# Patient Record
Sex: Female | Born: 1985
Health system: Southern US, Community
[De-identification: ages and names within clinical notes are randomized; demographics above are authoritative.]

## PROBLEM LIST (undated history)

## (undated) DIAGNOSIS — J189 Pneumonia, unspecified organism: Secondary | ICD-10-CM

## (undated) DIAGNOSIS — J45909 Unspecified asthma, uncomplicated: Secondary | ICD-10-CM

## (undated) DIAGNOSIS — M7630 Iliotibial band syndrome, unspecified leg: Secondary | ICD-10-CM

## (undated) DIAGNOSIS — G43909 Migraine, unspecified, not intractable, without status migrainosus: Secondary | ICD-10-CM

## (undated) DIAGNOSIS — IMO0002 Reserved for concepts with insufficient information to code with codable children: Secondary | ICD-10-CM

## (undated) DIAGNOSIS — M545 Low back pain, unspecified: Secondary | ICD-10-CM

## (undated) DIAGNOSIS — M1990 Unspecified osteoarthritis, unspecified site: Secondary | ICD-10-CM

## (undated) DIAGNOSIS — M5126 Other intervertebral disc displacement, lumbar region: Secondary | ICD-10-CM

## (undated) HISTORY — DX: Reserved for concepts with insufficient information to code with codable children: IMO0002

## (undated) HISTORY — DX: Other intervertebral disc displacement, lumbar region: M51.26

## (undated) HISTORY — DX: Unspecified asthma, uncomplicated: J45.909

## (undated) HISTORY — DX: Iliotibial band syndrome, unspecified leg: M76.30

---

## 2009-01-28 ENCOUNTER — Emergency Department (HOSPITAL_COMMUNITY): Admission: EM | Admit: 2009-01-28 | Discharge: 2009-01-28 | Payer: Self-pay | Admitting: Family Medicine

## 2011-05-04 ENCOUNTER — Emergency Department
Admission: EM | Admit: 2011-05-04 | Discharge: 2011-05-04 | Disposition: A | Discharge status: Home / Self Care | Payer: 59 | Source: Home / Self Care | Attending: Emergency Medicine | Admitting: Emergency Medicine

## 2011-05-04 ENCOUNTER — Encounter: Payer: Self-pay | Admitting: Emergency Medicine

## 2011-05-04 ENCOUNTER — Emergency Department
Admit: 2011-05-04 | Discharge: 2011-05-04 | Disposition: A | Discharge status: Home / Self Care | Payer: 59 | Attending: Emergency Medicine | Admitting: Emergency Medicine

## 2011-05-04 DIAGNOSIS — M25569 Pain in unspecified knee: Secondary | ICD-10-CM

## 2011-05-04 DIAGNOSIS — M25561 Pain in right knee: Secondary | ICD-10-CM

## 2011-05-04 HISTORY — DX: Low back pain: M54.5

## 2011-05-04 HISTORY — DX: Unspecified osteoarthritis, unspecified site: M19.90

## 2011-05-04 HISTORY — DX: Low back pain, unspecified: M54.50

## 2011-05-04 HISTORY — DX: Migraine, unspecified, not intractable, without status migrainosus: G43.909

## 2011-05-04 MED ORDER — TRAMADOL HCL 50 MG PO TABS: 50.0000 mg | ORAL_TABLET | Freq: Four times a day (QID) | ORAL | Status: AC | PRN

## 2011-05-04 NOTE — ED Notes (Signed)
Incidentally: trying to achieve pregnancy; currently negative HCG

## 2011-05-04 NOTE — ED Notes (Signed)
Right knee pain started at work after 6 miles of walking; hx knee problems in HS cheerleading.

## 2011-05-04 NOTE — ED Provider Notes (Signed)
History     CSN: [id number]  Arrival date & time 05/04/11  1242   First MD Initiated Contact with Patient 05/04/11 1330      Chief Complaint  Patient presents with  . 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 She does not recall any specific trauma, falling, or other injuries but feels that she overexerted herself. She now has 5/10 pain at rest and 10 at 10 with movement. It is located mostly on the front of her knee as well as the inner aspect. 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 She does not think that she can go back to work with this pain and did call orthopedist but they are not able to see her for a few days.  Past Medical History  Diagnosis Date  . Arthritis   . Low back pain   . Migraines     History reviewed. No pertinent past surgical history.  No family history on file.  History  Substance Use Topics  . Smoking status: Never Smoker   . Smokeless tobacco: Not on file  . Alcohol Use: Yes    OB History    Grav Para Term Preterm Abortions TAB SAB Ect Mult Living                  Review of Systems  Allergies  Review of patient's allergies indicates no known allergies.  Home Medications   Current Outpatient Rx  Name Route Sig Dispense Refill  . BUTALBITAL-APAP-CAFFEINE 50-325-40 MG PO TABS Oral Take 1 tablet by mouth at bedtime as needed.      Marland Kitchen RIZATRIPTAN BENZOATE 10 MG PO TABS Oral Take 10 mg by mouth at bedtime as needed. May repeat in 2 hours if needed     . TRAMADOL HCL 50 MG PO TABS Oral Take 1 tablet (50 mg total) by mouth every 6 (six) hours as needed for pain. Maximum dose= 8 tablets per day 24 tablet 0    BP 120/82  Pulse 89  Temp(Src) 98.5 F (36.9 C) (Oral)  Resp 16  Ht  5\' 4"  (1.626 m)  Wt 150 lb (68.04 kg)  BMI 25.75 kg/m2  SpO2 98%  LMP 02/02/2011  Physical Exam  Nursing note and vitals reviewed. Constitutional: She is oriented to person, place, and time. She appears well-developed and well-nourished.  HENT:  Head: Normocephalic and atraumatic.  Eyes: No scleral icterus.  Neck: Neck supple.  Cardiovascular: Regular rhythm and normal heart sounds.   Pulmonary/Chest: Effort normal and breath sounds normal. No respiratory distress.  Musculoskeletal:       R knee: Full range of motion but limited with full extension and full flexion by pain, +1 effusion, no ecchymoses, Anterior & posterior drawer normal, McMurrays is painful, Varus & valgus stress normal.  Patella freely mobile, Clarks compression test is painful as is manipulation of the patellar facets.  Good alignment. Distal neurovascular status is intact.   Neurological: She is alert and oriented to person, place, and time.  Skin: Skin is warm and dry.  Psychiatric: She has a normal mood and affect. Her speech is normal.    ED Course  Procedures (including critical care time)  Labs Reviewed - No data to display Dg  Knee Complete 4 Views Right  05/04/2011  *RADIOLOGY REPORT*  Clinical Data: Right knee pain and effusion.  RIGHT KNEE - COMPLETE 4+ VIEW  Comparison: None available.  Findings: The right knee is located.  There is no significant effusion.  No acute bone or soft tissue abnormality is present.  IMPRESSION: Negative right knee.  Original Report Authenticated By: Jamesetta Orleans. MATTERN, M.D.     1. Right knee pain       MDM   An x-ray was obtained of the right knee and is read by radiology as above.  Tramadol Rx given.  I also gave her. Like her to be seen by a sports medicine doctor or orthopedist this week.  She has an appointment with Dr. Penni Bombard in 2 days.  At that time they can discuss the need for physical therapy versus bracing versus advanced imaging.     Lily Kocher, MD 05/04/11 [phone]

## 2011-05-25 ENCOUNTER — Other Ambulatory Visit: Payer: Self-pay | Admitting: Sports Medicine

## 2011-05-25 DIAGNOSIS — M25551 Pain in right hip: Secondary | ICD-10-CM

## 2011-05-29 ENCOUNTER — Inpatient Hospital Stay (HOSPITAL_COMMUNITY): Admission: RE | Admit: 2011-05-29 | Payer: 59 | Source: Ambulatory Visit

## 2012-05-31 ENCOUNTER — Ambulatory Visit (INDEPENDENT_AMBULATORY_CARE_PROVIDER_SITE_OTHER): Payer: 59 | Admitting: Sports Medicine

## 2012-05-31 ENCOUNTER — Encounter: Payer: Self-pay | Admitting: Sports Medicine

## 2012-05-31 VITALS — BP 130/88 | HR 113 | Wt 169.0 lb

## 2012-05-31 DIAGNOSIS — R059 Cough, unspecified: Secondary | ICD-10-CM

## 2012-05-31 DIAGNOSIS — R05 Cough: Secondary | ICD-10-CM

## 2012-05-31 MED ORDER — HYDROCOD POLST-CHLORPHEN POLST 10-8 MG/5ML PO LQCR: 5.0000 mL | Freq: Two times a day (BID) | ORAL | Status: DC | PRN

## 2012-05-31 MED ORDER — ONDANSETRON 8 MG PO TBDP: 8.0000 mg | ORAL_TABLET | Freq: Three times a day (TID) | ORAL | Status: DC | PRN

## 2012-05-31 MED ORDER — MELOXICAM 15 MG PO TABS: ORAL_TABLET | ORAL | Status: DC

## 2012-05-31 MED ORDER — OSELTAMIVIR PHOSPHATE 75 MG PO CAPS: 75.0000 mg | ORAL_CAPSULE | Freq: Two times a day (BID) | ORAL | Status: DC

## 2012-05-31 NOTE — Assessment & Plan Note (Signed)
Symptoms likely represent viral respiratory infection, she does have some influenza-like symptoms. She is within the window, we will start Tamiflu. Tussionex for cough, Mobic for symptoms , Zofran for nausea. RTC 1 week prn.

## 2012-05-31 NOTE — Progress Notes (Signed)
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  She's had 2 days of weakness, fatigue, cough productive of yellowish sputum, mild nausea, sore throat. She denies nasal discharge, ear pain, sinus pressure, vomiting or diarrhea. Symptoms are worsening. She does have body aches, she did get her flu shot this year.  Past medical history, Surgical history, Family history not pertinant except as noted below, Social history, Allergies, and medications have been entered into the medical record, reviewed, and no changes needed.   Review of Systems: No headache, visual changes, vomiting, diarrhea, constipation, dizziness, abdominal pain, skin rash, fevers, chills, night sweats, swollen lymph nodes, weight loss, chest pain, joint swelling, shortness of breath, mood changes, visual or auditory hallucinations. Positive for muscle aches, body aches, nausea.  Objective:    General: Well Developed, well nourished, and in no acute distress.  Neuro: Alert and oriented x3, extra-ocular muscles intact, sensation grossly intact.  HEENT: Normocephalic, atraumatic, pupils equal round reactive to light, neck supple, no masses, no lymphadenopathy, thyroid nonpalpable. Oropharynx, nasopharynx, external ear canals are unremarkable to inspection. Skin: Warm and dry, no rashes noted.  Cardiac: Regular rate and rhythm, no murmurs rubs or gallops.  Respiratory: Clear to auscultation bilaterally. Not using accessory muscles, speaking in full sentences.  Abdominal: Soft, nontender, nondistended, positive bowel sounds, no masses, no organomegaly.  Musculoskeletal: Shoulder, elbow, wrist, hip, knee, ankle stable, and with full range of motion.  Impression and Recommendations:    The patient was counselled, risk factors were discussed, anticipatory guidance given.

## 2012-06-17 ENCOUNTER — Encounter: Payer: 59 | Admitting: Sports Medicine

## 2012-07-26 LAB — OB RESULTS CONSOLE RPR: RPR: NONREACTIVE

## 2012-07-26 LAB — OB RESULTS CONSOLE RUBELLA ANTIBODY, IGM: Rubella: IMMUNE

## 2012-07-26 LAB — OB RESULTS CONSOLE ABO/RH: RH Type: NEGATIVE

## 2012-07-26 LAB — OB RESULTS CONSOLE HIV ANTIBODY (ROUTINE TESTING): HIV: NONREACTIVE

## 2012-07-26 LAB — OB RESULTS CONSOLE HGB/HCT, BLOOD: Hemoglobin: 13.4 g/dL

## 2012-10-04 ENCOUNTER — Telehealth: Payer: Self-pay | Admitting: *Deleted

## 2012-10-04 DIAGNOSIS — Z3490 Encounter for supervision of normal pregnancy, unspecified, unspecified trimester: Secondary | ICD-10-CM

## 2012-10-04 NOTE — Telephone Encounter (Signed)
Pt scheduled for f/u U/S on 10/17/12 @ 7 am @ [facility] in [place]

## 2012-10-07 ENCOUNTER — Other Ambulatory Visit: Payer: Self-pay | Admitting: Obstetrics & Gynecology

## 2012-10-07 DIAGNOSIS — Z3490 Encounter for supervision of normal pregnancy, unspecified, unspecified trimester: Secondary | ICD-10-CM

## 2012-10-10 ENCOUNTER — Encounter: Payer: Self-pay | Admitting: Obstetrics and Gynecology

## 2012-10-10 ENCOUNTER — Encounter: Payer: Self-pay | Admitting: *Deleted

## 2012-10-11 ENCOUNTER — Ambulatory Visit (INDEPENDENT_AMBULATORY_CARE_PROVIDER_SITE_OTHER): Payer: 59 | Admitting: Obstetrics and Gynecology

## 2012-10-11 ENCOUNTER — Encounter: Payer: Self-pay | Admitting: Obstetrics and Gynecology

## 2012-10-11 VITALS — BP 134/80 | Wt 178.0 lb

## 2012-10-11 DIAGNOSIS — O360990 Maternal care for other rhesus isoimmunization, unspecified trimester, not applicable or unspecified: Secondary | ICD-10-CM

## 2012-10-11 DIAGNOSIS — O0902 Supervision of pregnancy with history of infertility, second trimester: Secondary | ICD-10-CM

## 2012-10-11 DIAGNOSIS — Z6791 Unspecified blood type, Rh negative: Secondary | ICD-10-CM | POA: Insufficient documentation

## 2012-10-11 DIAGNOSIS — O26899 Other specified pregnancy related conditions, unspecified trimester: Secondary | ICD-10-CM | POA: Insufficient documentation

## 2012-10-11 DIAGNOSIS — O3092 Multiple gestation, unspecified, second trimester: Secondary | ICD-10-CM

## 2012-10-11 DIAGNOSIS — G43909 Migraine, unspecified, not intractable, without status migrainosus: Secondary | ICD-10-CM

## 2012-10-11 DIAGNOSIS — Z3480 Encounter for supervision of other normal pregnancy, unspecified trimester: Secondary | ICD-10-CM

## 2012-10-11 DIAGNOSIS — O0900 Supervision of pregnancy with history of infertility, unspecified trimester: Secondary | ICD-10-CM

## 2012-10-11 DIAGNOSIS — O3090 Multiple gestation, unspecified, unspecified trimester: Secondary | ICD-10-CM | POA: Insufficient documentation

## 2012-10-11 DIAGNOSIS — Q270 Congenital absence and hypoplasia of umbilical artery: Secondary | ICD-10-CM | POA: Insufficient documentation

## 2012-10-11 DIAGNOSIS — O360123 Maternal care for anti-D [Rh] antibodies, second trimester, fetus 3: Secondary | ICD-10-CM

## 2012-10-11 NOTE — Progress Notes (Signed)
Patient transferred care from [facility]. Limited prenatal record consisting mainly of 3  ultrasounds and prenatal labs reviewed. All questions answered. Patient is otherwise doing well and is without complaints. She is planning on breast feeding. She remains undecided contraception. Nature of our physician/midwife practise explained. F/U MFM ultrasound on 6/13

## 2012-10-11 NOTE — Progress Notes (Signed)
p=120 

## 2012-10-14 ENCOUNTER — Ambulatory Visit (HOSPITAL_COMMUNITY)
Admission: RE | Admit: 2012-10-14 | Discharge: 2012-10-14 | Disposition: A | Discharge status: Home / Self Care | Payer: 59 | Source: Ambulatory Visit | Attending: Obstetrics & Gynecology | Admitting: Obstetrics & Gynecology

## 2012-10-14 VITALS — BP 111/69 | HR 115 | Wt 180.2 lb

## 2012-10-14 DIAGNOSIS — Z3490 Encounter for supervision of normal pregnancy, unspecified, unspecified trimester: Secondary | ICD-10-CM

## 2012-10-14 DIAGNOSIS — Z1389 Encounter for screening for other disorder: Secondary | ICD-10-CM | POA: Insufficient documentation

## 2012-10-14 DIAGNOSIS — Z363 Encounter for antenatal screening for malformations: Secondary | ICD-10-CM | POA: Insufficient documentation

## 2012-10-14 DIAGNOSIS — O30109 Triplet pregnancy, unspecified number of placenta and unspecified number of amniotic sacs, unspecified trimester: Secondary | ICD-10-CM | POA: Insufficient documentation

## 2012-10-14 DIAGNOSIS — O3092 Multiple gestation, unspecified, second trimester: Secondary | ICD-10-CM

## 2012-10-14 DIAGNOSIS — O358XX0 Maternal care for other (suspected) fetal abnormality and damage, not applicable or unspecified: Secondary | ICD-10-CM | POA: Insufficient documentation

## 2012-10-14 NOTE — Progress Notes (Signed)
Tina Jennings  was seen today for an ultrasound appointment.  See full report in [facility].  Impression:  Tri/ Tri triplet gestation with best dates of 21 3/7 weeks  A: breech, anterior placenta     Single umbilical artery     Normal fetal anatomy, although limited views of the fetal heart and spine obtained due to fetal position     No other markers associated with aneuploidy identified     Normal amniotic fluid volume  B: breech, maternal right      Normal fetal anatomy; limited views of the fetal heart obtained due to fetal position      No markers associated with aneuploidy noted      Normal amniotic fluid volume  C: transverse, posterior placenta      Normal fetal anatmoy; RVOT not seen      No markers associated with aneuploidy noted      Normal amniotic fluid volume  Recommendations:  Fetal echo due to 2 vessel cord in Triplet A. Follow up growth in 3-4 weeks.  Alpha Gula, MD

## 2012-10-17 ENCOUNTER — Ambulatory Visit (HOSPITAL_COMMUNITY): Payer: 59

## 2012-11-01 ENCOUNTER — Ambulatory Visit (INDEPENDENT_AMBULATORY_CARE_PROVIDER_SITE_OTHER): Payer: 59 | Admitting: Obstetrics & Gynecology

## 2012-11-01 VITALS — BP 126/79 | Wt 181.0 lb

## 2012-11-01 DIAGNOSIS — O3090 Multiple gestation, unspecified, unspecified trimester: Secondary | ICD-10-CM

## 2012-11-01 DIAGNOSIS — Q270 Congenital absence and hypoplasia of umbilical artery: Secondary | ICD-10-CM

## 2012-11-01 DIAGNOSIS — Z3480 Encounter for supervision of other normal pregnancy, unspecified trimester: Secondary | ICD-10-CM

## 2012-11-01 DIAGNOSIS — O3092 Multiple gestation, unspecified, second trimester: Secondary | ICD-10-CM

## 2012-11-01 NOTE — Progress Notes (Signed)
Followed by serial growth scans at [facility]. 1 hr GTT, third trimester labs, Tdap, Rhogam at next visit.  No other complaints or concerns.  Fetal movement and labor precautions reviewed.

## 2012-11-01 NOTE — Progress Notes (Signed)
p=107 

## 2012-11-01 NOTE — Patient Instructions (Signed)
Return to clinic for any obstetric concerns or go to [facility] for evaluation  

## 2012-11-02 ENCOUNTER — Ambulatory Visit (HOSPITAL_COMMUNITY)
Admission: RE | Admit: 2012-11-02 | Discharge: 2012-11-02 | Disposition: A | Discharge status: Home / Self Care | Payer: 59 | Source: Ambulatory Visit | Attending: Obstetrics & Gynecology | Admitting: Obstetrics & Gynecology

## 2012-11-02 DIAGNOSIS — O30109 Triplet pregnancy, unspecified number of placenta and unspecified number of amniotic sacs, unspecified trimester: Secondary | ICD-10-CM | POA: Insufficient documentation

## 2012-11-02 DIAGNOSIS — O3092 Multiple gestation, unspecified, second trimester: Secondary | ICD-10-CM

## 2012-11-02 DIAGNOSIS — Z3689 Encounter for other specified antenatal screening: Secondary | ICD-10-CM | POA: Insufficient documentation

## 2012-11-02 NOTE — Progress Notes (Signed)
Ms. Brewton was seen for ultrasound appointment today.  Please see [facility] report for details.

## 2012-11-04 ENCOUNTER — Encounter: Payer: Self-pay | Admitting: Obstetrics & Gynecology

## 2012-11-24 MED ORDER — RHO D IMMUNE GLOBULIN 1500 UNIT/2ML IJ SOLN
300.0000 ug | Freq: Once | INTRAMUSCULAR | Status: AC
  Administered 2012-11-25: 300 ug via INTRAMUSCULAR

## 2012-11-24 MED ORDER — TETANUS-DIPHTH-ACELL PERTUSSIS 5-2.5-18.5 LF-MCG/0.5 IM SUSP
0.5000 mL | Freq: Once | INTRAMUSCULAR | Status: AC
  Administered 2012-11-25: 0.5 mL via INTRAMUSCULAR

## 2012-11-25 ENCOUNTER — Ambulatory Visit (INDEPENDENT_AMBULATORY_CARE_PROVIDER_SITE_OTHER): Payer: 59 | Admitting: Advanced Practice Midwife

## 2012-11-25 ENCOUNTER — Other Ambulatory Visit: Payer: Self-pay | Admitting: Obstetrics & Gynecology

## 2012-11-25 VITALS — BP 120/81 | Wt 188.0 lb

## 2012-11-25 DIAGNOSIS — O3090 Multiple gestation, unspecified, unspecified trimester: Secondary | ICD-10-CM

## 2012-11-25 DIAGNOSIS — Z23 Encounter for immunization: Secondary | ICD-10-CM

## 2012-11-25 DIAGNOSIS — Z3480 Encounter for supervision of other normal pregnancy, unspecified trimester: Secondary | ICD-10-CM

## 2012-11-25 DIAGNOSIS — O360990 Maternal care for other rhesus isoimmunization, unspecified trimester, not applicable or unspecified: Secondary | ICD-10-CM

## 2012-11-25 DIAGNOSIS — O30109 Triplet pregnancy, unspecified number of placenta and unspecified number of amniotic sacs, unspecified trimester: Secondary | ICD-10-CM

## 2012-11-25 DIAGNOSIS — O3092 Multiple gestation, unspecified, second trimester: Secondary | ICD-10-CM

## 2012-11-25 DIAGNOSIS — O360123 Maternal care for anti-D [Rh] antibodies, second trimester, fetus 3: Secondary | ICD-10-CM

## 2012-11-25 LAB — CBC
HCT: 35 % — ABNORMAL LOW (ref 36.0–46.0)
Hemoglobin: 11.9 g/dL — ABNORMAL LOW (ref 12.0–15.0)
MCH: 30.7 pg (ref 26.0–34.0)
MCHC: 34 g/dL (ref 30.0–36.0)
MCV: 90.2 fL (ref 78.0–100.0)
RBC: 3.88 MIL/uL (ref 3.87–5.11)

## 2012-11-25 MED ORDER — TETANUS-DIPHTH-ACELL PERTUSSIS 5-2.5-18.5 LF-MCG/0.5 IM SUSP: 0.5000 mL | Freq: Once | INTRAMUSCULAR | Status: DC

## 2012-11-25 MED ORDER — RHO D IMMUNE GLOBULIN 1500 UNIT/2ML IJ SOLN: 300.0000 ug | Freq: Once | INTRAMUSCULAR | Status: DC

## 2012-11-25 NOTE — Progress Notes (Signed)
Doing well.  Good fetal movement, denies vaginal bleeding, LOF, regular contractions.  Working as a Engineer, civil (consulting) at [employer]. Has 2+ pitting edema after working, resolves with rest, elevation.  Discussed support socks/stockings.  Answered questions about C/S and recovery, and about breastfeeding.

## 2012-11-25 NOTE — Progress Notes (Signed)
P - 102 - FHR: 141, 153, and 137

## 2012-11-26 LAB — RPR

## 2012-11-26 LAB — ANTIBODY SCREEN: Antibody Screen: NEGATIVE

## 2012-11-26 LAB — HIV ANTIBODY (ROUTINE TESTING W REFLEX): HIV: NONREACTIVE

## 2012-11-28 ENCOUNTER — Telehealth: Payer: Self-pay | Admitting: *Deleted

## 2012-11-28 NOTE — Telephone Encounter (Signed)
LM on cell phone that she did pass her 1 hr GTT.

## 2012-11-29 ENCOUNTER — Ambulatory Visit (HOSPITAL_COMMUNITY)
Admission: RE | Admit: 2012-11-29 | Discharge: 2012-11-29 | Disposition: A | Discharge status: Home / Self Care | Payer: 59 | Source: Ambulatory Visit | Attending: Family Medicine | Admitting: Family Medicine

## 2012-11-29 DIAGNOSIS — Z3689 Encounter for other specified antenatal screening: Secondary | ICD-10-CM | POA: Insufficient documentation

## 2012-11-29 DIAGNOSIS — O30109 Triplet pregnancy, unspecified number of placenta and unspecified number of amniotic sacs, unspecified trimester: Secondary | ICD-10-CM | POA: Insufficient documentation

## 2012-12-05 ENCOUNTER — Encounter: Payer: Self-pay | Admitting: Obstetrics & Gynecology

## 2012-12-06 ENCOUNTER — Encounter: Payer: Self-pay | Admitting: *Deleted

## 2012-12-13 ENCOUNTER — Ambulatory Visit (INDEPENDENT_AMBULATORY_CARE_PROVIDER_SITE_OTHER): Payer: 59 | Admitting: Obstetrics & Gynecology

## 2012-12-13 VITALS — BP 143/91 | Wt 202.0 lb

## 2012-12-13 DIAGNOSIS — Z3480 Encounter for supervision of other normal pregnancy, unspecified trimester: Secondary | ICD-10-CM

## 2012-12-13 DIAGNOSIS — O133 Gestational [pregnancy-induced] hypertension without significant proteinuria, third trimester: Secondary | ICD-10-CM

## 2012-12-13 DIAGNOSIS — O139 Gestational [pregnancy-induced] hypertension without significant proteinuria, unspecified trimester: Secondary | ICD-10-CM

## 2012-12-13 NOTE — Progress Notes (Signed)
p-88  BP recheck 136/93  PIH labs drawn

## 2012-12-13 NOTE — Progress Notes (Signed)
P - 123 

## 2012-12-14 ENCOUNTER — Ambulatory Visit (HOSPITAL_COMMUNITY)
Admission: RE | Admit: 2012-12-14 | Discharge: 2012-12-14 | Disposition: A | Discharge status: Home / Self Care | Payer: 59 | Source: Ambulatory Visit | Attending: Obstetrics & Gynecology | Admitting: Obstetrics & Gynecology

## 2012-12-14 ENCOUNTER — Other Ambulatory Visit: Payer: Self-pay | Admitting: Obstetrics & Gynecology

## 2012-12-14 ENCOUNTER — Encounter: Payer: Self-pay | Admitting: *Deleted

## 2012-12-14 VITALS — BP 128/85 | HR 92 | Wt 203.0 lb

## 2012-12-14 DIAGNOSIS — O139 Gestational [pregnancy-induced] hypertension without significant proteinuria, unspecified trimester: Secondary | ICD-10-CM | POA: Insufficient documentation

## 2012-12-14 DIAGNOSIS — O30109 Triplet pregnancy, unspecified number of placenta and unspecified number of amniotic sacs, unspecified trimester: Secondary | ICD-10-CM | POA: Insufficient documentation

## 2012-12-14 DIAGNOSIS — O133 Gestational [pregnancy-induced] hypertension without significant proteinuria, third trimester: Secondary | ICD-10-CM

## 2012-12-14 DIAGNOSIS — O409XX0 Polyhydramnios, unspecified trimester, not applicable or unspecified: Secondary | ICD-10-CM | POA: Insufficient documentation

## 2012-12-14 DIAGNOSIS — IMO0001 Reserved for inherently not codable concepts without codable children: Secondary | ICD-10-CM | POA: Insufficient documentation

## 2012-12-14 LAB — COMPREHENSIVE METABOLIC PANEL
ALT: 19 U/L (ref 0–35)
Albumin: 2.7 g/dL — ABNORMAL LOW (ref 3.5–5.2)
CO2: 21 mEq/L (ref 19–32)
Calcium: 8.5 mg/dL (ref 8.4–10.5)
Chloride: 105 mEq/L (ref 96–112)
Glucose, Bld: 117 mg/dL — ABNORMAL HIGH (ref 70–99)
Sodium: 137 mEq/L (ref 135–145)
Total Bilirubin: 0.8 mg/dL (ref 0.3–1.2)
Total Protein: 5.3 g/dL — ABNORMAL LOW (ref 6.0–8.3)

## 2012-12-14 LAB — CBC
Hemoglobin: 11.5 g/dL — ABNORMAL LOW (ref 12.0–15.0)
MCH: 30.2 pg (ref 26.0–34.0)
Platelets: 148 10*3/uL — ABNORMAL LOW (ref 150–400)
RBC: 3.81 MIL/uL — ABNORMAL LOW (ref 3.87–5.11)
WBC: 8.8 10*3/uL (ref 4.0–10.5)

## 2012-12-14 NOTE — Progress Notes (Signed)
BP slightly elevated today.  Will get labs and 24 hour urine.  BPP tomorrow with MFM.  Preeclampsia precautions given.  MFM to give further guidelines on delivery 34-36 weeks.  Pt prefers 36 weeks.

## 2012-12-14 NOTE — Progress Notes (Signed)
[facility] ultrasound  Indication: 27 yr old G1P0 at [redacted]w[redacted]d with trichorionic/triamniotic triplet gestation for BPP. Triplet A with single umbilical artery; triplet B with polyhydramnios.  Findings: 1. Trichorionic/triamniotic triplet gestation; the dividing membranes are seen. 2. Triplet A and B placentas are anterior; triplet C placenta is posterior; there is no evidence of previa. 3. Normal amniotic fluid volume for triplets A and C; triplet B has polyhydramnios with maximum vertical pocket of 9.9cm. 4. Normal biophysical profiles of 8/8 for all three fetuses.  Recommendations: 1. Triplet pregnancy: - previously counseled - recommend fetal growth every 4 weeks; due in 2 weeks - recommend antenatal testing with weekly BPPs - preterm labor precautions - recommend delivery at 36 weeks - would have low threshold for administering betamethasone (contractions, cervical change, etc) 2. Polyhydramnios in triplet B: - unclear etiology - discussed possible etiologies: idiopathic, gestational diabetes (had normal glucola), anomaly (normal stomach seen), aneuploidy, infection, neuromuscular disorder (normal fetal movement seen)- based on previous studies likely idiopathic - there is an increased risk of preterm labor/PPROM and stillbirth therefore recommend precautions and antenatal testing as above 3. Triplet A with single umbilical artery: - previously counseled - follow fetal growth - triplet A had normal fetal echocardiogram  Eulis Foster, MD

## 2012-12-15 ENCOUNTER — Other Ambulatory Visit: Payer: Self-pay | Admitting: *Deleted

## 2012-12-15 NOTE — Telephone Encounter (Signed)
Message copied by Granville Lewis on Thu Dec 15, 2012  9:24 AM ------      Message from: Lesly Dukes      Created: Wed Dec 14, 2012 10:01 PM       plts mildly low, protein creatinine ratio is .26, creatinine is high nml.  I think she is definitely brewing.  Question si when to give steroids.  I should have low threshold so I am thinking next week when we see her.  I'm going to think about it tonight.  Technicially she doesn't have a diagnosis yet.   ------

## 2012-12-15 NOTE — Telephone Encounter (Signed)
Spoke with pt about labs and possibility of getting Betamethasone next week at her appt. per  Dr Penne Lash .

## 2012-12-16 ENCOUNTER — Other Ambulatory Visit: Payer: Self-pay | Admitting: Obstetrics & Gynecology

## 2012-12-17 LAB — CREATININE CLEARANCE, URINE, 24 HOUR
Creatinine, Urine: 85.8 mg/dL
Creatinine: 0.79 mg/dL (ref 0.50–1.10)

## 2012-12-18 ENCOUNTER — Telehealth: Payer: Self-pay | Admitting: Obstetrics & Gynecology

## 2012-12-18 ENCOUNTER — Other Ambulatory Visit: Payer: Self-pay | Admitting: Obstetrics & Gynecology

## 2012-12-18 DIAGNOSIS — O24313 Unspecified pre-existing diabetes mellitus in pregnancy, third trimester: Secondary | ICD-10-CM | POA: Insufficient documentation

## 2012-12-18 LAB — PROTEIN, URINE, 24 HOUR
Protein, 24H Urine: 440 mg/d — ABNORMAL HIGH (ref 50–100)
Protein, Urine: 22 mg/dL

## 2012-12-18 NOTE — Telephone Encounter (Signed)
Pt turned in 24 hour urine-->440mg  of protein.  Pt needs betamethasone, rpt labs, MFM co management for antenatal testing and delivery.    Will rpt labs next week.  Pt aware and coming to [facility] for betamethasone on Monday and Tuesday.

## 2012-12-19 ENCOUNTER — Other Ambulatory Visit: Payer: Self-pay | Admitting: Obstetrics & Gynecology

## 2012-12-19 ENCOUNTER — Ambulatory Visit (INDEPENDENT_AMBULATORY_CARE_PROVIDER_SITE_OTHER): Payer: 59 | Admitting: *Deleted

## 2012-12-19 VITALS — BP 138/90

## 2012-12-19 DIAGNOSIS — O30109 Triplet pregnancy, unspecified number of placenta and unspecified number of amniotic sacs, unspecified trimester: Secondary | ICD-10-CM

## 2012-12-19 DIAGNOSIS — O3093 Multiple gestation, unspecified, third trimester: Secondary | ICD-10-CM

## 2012-12-19 DIAGNOSIS — O3090 Multiple gestation, unspecified, unspecified trimester: Secondary | ICD-10-CM

## 2012-12-19 DIAGNOSIS — R809 Proteinuria, unspecified: Secondary | ICD-10-CM

## 2012-12-19 MED ORDER — BETAMETHASONE SOD PHOS & ACET 6 (3-3) MG/ML IJ SUSP
12.0000 mg | INTRAMUSCULAR | Status: AC
  Administered 2012-12-19: 12 mg via INTRAMUSCULAR

## 2012-12-19 NOTE — Progress Notes (Signed)
[facility] patient in today for betamethasone only. Pt states that she is returning to [facility] tomorrow for the second BMZ injection. Note routed to Dr. Penne Lash.

## 2012-12-20 ENCOUNTER — Encounter: Payer: 59 | Admitting: Obstetrics & Gynecology

## 2012-12-20 ENCOUNTER — Encounter (HOSPITAL_COMMUNITY): Payer: Self-pay

## 2012-12-20 ENCOUNTER — Ambulatory Visit (INDEPENDENT_AMBULATORY_CARE_PROVIDER_SITE_OTHER): Payer: 59 | Admitting: *Deleted

## 2012-12-20 ENCOUNTER — Ambulatory Visit (HOSPITAL_COMMUNITY)
Admission: RE | Admit: 2012-12-20 | Discharge: 2012-12-20 | Disposition: A | Discharge status: Home / Self Care | Payer: 59 | Source: Ambulatory Visit | Attending: Obstetrics & Gynecology | Admitting: Obstetrics & Gynecology

## 2012-12-20 VITALS — BP 133/86 | HR 105 | Wt 210.0 lb

## 2012-12-20 DIAGNOSIS — O133 Gestational [pregnancy-induced] hypertension without significant proteinuria, third trimester: Secondary | ICD-10-CM

## 2012-12-20 DIAGNOSIS — O1403 Mild to moderate pre-eclampsia, third trimester: Secondary | ICD-10-CM

## 2012-12-20 DIAGNOSIS — IMO0001 Reserved for inherently not codable concepts without codable children: Secondary | ICD-10-CM | POA: Insufficient documentation

## 2012-12-20 DIAGNOSIS — O30109 Triplet pregnancy, unspecified number of placenta and unspecified number of amniotic sacs, unspecified trimester: Secondary | ICD-10-CM | POA: Insufficient documentation

## 2012-12-20 DIAGNOSIS — R809 Proteinuria, unspecified: Secondary | ICD-10-CM

## 2012-12-20 DIAGNOSIS — O409XX0 Polyhydramnios, unspecified trimester, not applicable or unspecified: Secondary | ICD-10-CM | POA: Insufficient documentation

## 2012-12-20 DIAGNOSIS — O3093 Multiple gestation, unspecified, third trimester: Secondary | ICD-10-CM

## 2012-12-20 DIAGNOSIS — O1410 Severe pre-eclampsia, unspecified trimester: Secondary | ICD-10-CM | POA: Insufficient documentation

## 2012-12-20 DIAGNOSIS — O139 Gestational [pregnancy-induced] hypertension without significant proteinuria, unspecified trimester: Secondary | ICD-10-CM | POA: Insufficient documentation

## 2012-12-20 DIAGNOSIS — IMO0002 Reserved for concepts with insufficient information to code with codable children: Secondary | ICD-10-CM

## 2012-12-20 DIAGNOSIS — O3090 Multiple gestation, unspecified, unspecified trimester: Secondary | ICD-10-CM

## 2012-12-20 LAB — CBC
HCT: 36.7 % (ref 36.0–46.0)
Hemoglobin: 12.6 g/dL (ref 12.0–15.0)
MCHC: 34.3 g/dL (ref 30.0–36.0)
RBC: 4.06 MIL/uL (ref 3.87–5.11)
WBC: 12.8 10*3/uL — ABNORMAL HIGH (ref 4.0–10.5)

## 2012-12-20 LAB — COMPREHENSIVE METABOLIC PANEL
Albumin: 2.7 g/dL — ABNORMAL LOW (ref 3.5–5.2)
Alkaline Phosphatase: 238 U/L — ABNORMAL HIGH (ref 39–117)
BUN: 6 mg/dL (ref 6–23)
Calcium: 8.5 mg/dL (ref 8.4–10.5)
Chloride: 106 mEq/L (ref 96–112)
Glucose, Bld: 74 mg/dL (ref 70–99)
Potassium: 4 mEq/L (ref 3.5–5.3)
Sodium: 137 mEq/L (ref 135–145)
Total Protein: 5.1 g/dL — ABNORMAL LOW (ref 6.0–8.3)

## 2012-12-20 MED ORDER — BETAMETHASONE SOD PHOS & ACET 6 (3-3) MG/ML IJ SUSP
12.0000 mg | Freq: Once | INTRAMUSCULAR | Status: AC
  Administered 2012-12-20: 12 mg via INTRAMUSCULAR

## 2012-12-20 NOTE — Progress Notes (Signed)
Pt here for 2nd Betamethasone injection of 12 mg.  CBC and CMP drawn today.  BP today is 133/86.

## 2012-12-20 NOTE — Addendum Note (Signed)
Addended by: Jaynie Collins A on: 12/20/2012 11:04 PM   Modules accepted: Orders

## 2012-12-21 ENCOUNTER — Telehealth: Payer: Self-pay | Admitting: *Deleted

## 2012-12-21 ENCOUNTER — Encounter: Payer: Self-pay | Admitting: Obstetrics & Gynecology

## 2012-12-21 NOTE — Telephone Encounter (Signed)
Dr Macon Large sent a message stating: "I was called by Loney Loh to report labs tonight. Please call [facility] to ensure they run the added-on LDH, I ordered this as an addendum order. Labs on 12/20/12 AST 42, ALT 34, Cr 0.91, Plts 145K, LDH added on. No severe features or HELLP yet. Recheck labs at next visit (12/22/12 with Dr. Penne Lash), consider admission for BMZ if concerning. Of note, patient is preeclamptic because DBP > 90 on 12/14/12 and 12/19/12. 24 hour protein 440 mg on 12/16/12. This was added to her problem list."  I verified LDH was added on but not resulted as of right now. Pt is to follow up with Dr. Penne Lash 12/22/12

## 2012-12-22 ENCOUNTER — Encounter: Payer: Self-pay | Admitting: *Deleted

## 2012-12-22 ENCOUNTER — Ambulatory Visit (INDEPENDENT_AMBULATORY_CARE_PROVIDER_SITE_OTHER): Payer: 59 | Admitting: Obstetrics & Gynecology

## 2012-12-22 VITALS — BP 147/92 | Wt 211.0 lb

## 2012-12-22 DIAGNOSIS — O30109 Triplet pregnancy, unspecified number of placenta and unspecified number of amniotic sacs, unspecified trimester: Secondary | ICD-10-CM

## 2012-12-22 DIAGNOSIS — Z3480 Encounter for supervision of other normal pregnancy, unspecified trimester: Secondary | ICD-10-CM

## 2012-12-22 NOTE — Progress Notes (Signed)
P - 89 - Pt has suffered with acid reflux with vomiting

## 2012-12-22 NOTE — Progress Notes (Signed)
Pt will have biweekly BPP and needs CMP and CBC weekly per Dr Penne Lash.  BPP's have been scheduled for the next few weeks.

## 2012-12-22 NOTE — Progress Notes (Signed)
Pt checks BP 6-7  at home   Highest has been 131/89.  Today's BP is 147/92.  Pt denies headaches, scotoma, RUQ pain.  Pt has been having reflux (burping acid) not relieved by Tums.  Pt to try Zantac.  If not better then pt will move to ppi.  Pt has mild preeclampsia.  SAT slightly elevated at 42 and plt stable at 145.  Lab ppt today.  BPP with MFM tomorrow with co-managing care.

## 2012-12-23 ENCOUNTER — Inpatient Hospital Stay (HOSPITAL_COMMUNITY): Payer: 59 | Admitting: Anesthesiology

## 2012-12-23 ENCOUNTER — Ambulatory Visit (HOSPITAL_COMMUNITY)
Admission: RE | Admit: 2012-12-23 | Discharge: 2012-12-23 | Disposition: A | Discharge status: Home / Self Care | Payer: 59 | Source: Ambulatory Visit | Attending: Obstetrics & Gynecology | Admitting: Obstetrics & Gynecology

## 2012-12-23 ENCOUNTER — Encounter (HOSPITAL_COMMUNITY)
Admission: AD | Disposition: A | Discharge status: Home / Self Care | Payer: Self-pay | Source: Ambulatory Visit | Attending: Obstetrics & Gynecology

## 2012-12-23 ENCOUNTER — Encounter (HOSPITAL_COMMUNITY): Payer: Self-pay | Admitting: *Deleted

## 2012-12-23 ENCOUNTER — Encounter (HOSPITAL_COMMUNITY): Payer: Self-pay | Admitting: Anesthesiology

## 2012-12-23 ENCOUNTER — Inpatient Hospital Stay (HOSPITAL_COMMUNITY)
Admission: AD | Admit: 2012-12-23 | Discharge: 2012-12-25 | DRG: 765 | Disposition: A | Discharge status: Home / Self Care | Payer: 59 | Source: Ambulatory Visit | Attending: Obstetrics & Gynecology | Admitting: Obstetrics & Gynecology

## 2012-12-23 DIAGNOSIS — O3090 Multiple gestation, unspecified, unspecified trimester: Secondary | ICD-10-CM | POA: Diagnosis present

## 2012-12-23 DIAGNOSIS — O26899 Other specified pregnancy related conditions, unspecified trimester: Secondary | ICD-10-CM | POA: Diagnosis present

## 2012-12-23 DIAGNOSIS — O1410 Severe pre-eclampsia, unspecified trimester: Secondary | ICD-10-CM | POA: Diagnosis present

## 2012-12-23 DIAGNOSIS — Q270 Congenital absence and hypoplasia of umbilical artery: Secondary | ICD-10-CM

## 2012-12-23 DIAGNOSIS — O360990 Maternal care for other rhesus isoimmunization, unspecified trimester, not applicable or unspecified: Secondary | ICD-10-CM | POA: Diagnosis present

## 2012-12-23 DIAGNOSIS — O30109 Triplet pregnancy, unspecified number of placenta and unspecified number of amniotic sacs, unspecified trimester: Secondary | ICD-10-CM

## 2012-12-23 DIAGNOSIS — Z6791 Unspecified blood type, Rh negative: Secondary | ICD-10-CM | POA: Diagnosis present

## 2012-12-23 DIAGNOSIS — O1414 Severe pre-eclampsia complicating childbirth: Secondary | ICD-10-CM

## 2012-12-23 LAB — CBC
HCT: 33.4 % — ABNORMAL LOW (ref 36.0–46.0)
Hemoglobin: 11.3 g/dL — ABNORMAL LOW (ref 12.0–15.0)
Hemoglobin: 11.5 g/dL — ABNORMAL LOW (ref 12.0–15.0)
MCH: 30.1 pg (ref 26.0–34.0)
MCH: 31.4 pg (ref 26.0–34.0)
MCH: 30.3 pg (ref 26.0–34.0)
MCHC: 33.8 g/dL (ref 30.0–36.0)
MCHC: 34.4 g/dL (ref 30.0–36.0)
MCHC: 34.1 g/dL (ref 30.0–36.0)
MCV: 89 fL (ref 78.0–100.0)
Platelets: 108 10*3/uL — ABNORMAL LOW (ref 150–400)
Platelets: 118 10*3/uL — ABNORMAL LOW (ref 150–400)
RBC: 3.76 MIL/uL — ABNORMAL LOW (ref 3.87–5.11)
RBC: 3.56 MIL/uL — ABNORMAL LOW (ref 3.87–5.11)
RDW: 13.5 % (ref 11.5–15.5)
RDW: 13.5 % (ref 11.5–15.5)
WBC: 9.4 10*3/uL (ref 4.0–10.5)
WBC: 10.1 10*3/uL (ref 4.0–10.5)

## 2012-12-23 LAB — COMPREHENSIVE METABOLIC PANEL
ALT: 46 U/L — ABNORMAL HIGH (ref 0–35)
AST: 59 U/L — ABNORMAL HIGH (ref 0–37)
Albumin: 2.6 g/dL — ABNORMAL LOW (ref 3.5–5.2)
Alkaline Phosphatase: 278 U/L — ABNORMAL HIGH (ref 39–117)
BUN: 7 mg/dL (ref 6–23)
CO2: 26 mEq/L (ref 19–32)
CO2: 22 mEq/L (ref 19–32)
CO2: 23 mEq/L (ref 19–32)
Calcium: 8.4 mg/dL (ref 8.4–10.5)
Calcium: 7.8 mg/dL — ABNORMAL LOW (ref 8.4–10.5)
Chloride: 105 mEq/L (ref 96–112)
Creatinine, Ser: 0.81 mg/dL (ref 0.50–1.10)
GFR calc Af Amer: >90 mL/min (ref >90)
GFR calc Af Amer: >90 mL/min (ref >90)
GFR calc non Af Amer: >90 mL/min (ref >90)
GFR calc non Af Amer: >90 mL/min (ref >90)
Glucose, Bld: 75 mg/dL (ref 70–99)
Glucose, Bld: 76 mg/dL (ref 70–99)
Potassium: 3.6 mEq/L (ref 3.5–5.1)
Sodium: 138 mEq/L (ref 135–145)
Sodium: 137 mEq/L (ref 135–145)
Total Bilirubin: 0.9 mg/dL (ref 0.3–1.2)
Total Protein: 5 g/dL — ABNORMAL LOW (ref 6.0–8.3)
Total Protein: 5.1 g/dL — ABNORMAL LOW (ref 6.0–8.3)

## 2012-12-23 LAB — URINALYSIS, ROUTINE W REFLEX MICROSCOPIC
Glucose, UA: NEGATIVE mg/dL
Ketones, ur: NEGATIVE mg/dL
Leukocytes, UA: NEGATIVE
Nitrite: NEGATIVE
Protein, ur: 30 mg/dL — AB
Urobilinogen, UA: 0.2 mg/dL (ref 0.0–1.0)

## 2012-12-23 LAB — URINE MICROSCOPIC-ADD ON

## 2012-12-23 LAB — PREPARE RBC (CROSSMATCH)

## 2012-12-23 SURGERY — Surgical Case
Anesthesia: Spinal | Site: Abdomen | Wound class: Clean Contaminated

## 2012-12-23 MED ORDER — FAMOTIDINE IN NACL 20-0.9 MG/50ML-% IV SOLN
20.0000 mg | Freq: Once | INTRAVENOUS | Status: AC
  Administered 2012-12-23: 20 mg via INTRAVENOUS
  Filled 2012-12-23: qty 50

## 2012-12-23 MED ORDER — DIPHENHYDRAMINE HCL 50 MG/ML IJ SOLN: 25.0000 mg | INTRAMUSCULAR | Status: DC | PRN

## 2012-12-23 MED ORDER — FENTANYL CITRATE 0.05 MG/ML IJ SOLN: 25.0000 ug | INTRAMUSCULAR | Status: DC | PRN

## 2012-12-23 MED ORDER — NALOXONE HCL 1 MG/ML IJ SOLN: 1.0000 ug/kg/h | INTRAVENOUS | Status: DC | PRN

## 2012-12-23 MED ORDER — MEPERIDINE HCL 25 MG/ML IJ SOLN
INTRAMUSCULAR | Status: AC
  Administered 2012-12-23: 12.5 mg via INTRAVENOUS
  Filled 2012-12-23: qty 1

## 2012-12-23 MED ORDER — LACTATED RINGERS IV SOLN
INTRAVENOUS | Status: DC
  Administered 2012-12-23: 16:00:00 via INTRAVENOUS

## 2012-12-23 MED ORDER — FENTANYL CITRATE 0.05 MG/ML IJ SOLN
INTRAMUSCULAR | Status: DC | PRN
  Administered 2012-12-23: 25 ug via INTRATHECAL

## 2012-12-23 MED ORDER — PRENATAL MULTIVITAMIN CH
1.0000 | ORAL_TABLET | Freq: Every day | ORAL | Status: DC
  Filled 2012-12-23 (×2): qty 1

## 2012-12-23 MED ORDER — DIPHENHYDRAMINE HCL 25 MG PO CAPS: 25.0000 mg | ORAL_CAPSULE | ORAL | Status: DC | PRN

## 2012-12-23 MED ORDER — ONDANSETRON HCL 4 MG/2ML IJ SOLN
INTRAMUSCULAR | Status: AC
  Filled 2012-12-23: qty 2

## 2012-12-23 MED ORDER — MAGNESIUM SULFATE BOLUS VIA INFUSION
4.0000 g | Freq: Once | INTRAVENOUS | Status: AC
  Administered 2012-12-23: 4 g via INTRAVENOUS
  Filled 2012-12-23: qty 500

## 2012-12-23 MED ORDER — SCOPOLAMINE 1 MG/3DAYS TD PT72
1.0000 | MEDICATED_PATCH | Freq: Once | TRANSDERMAL | Status: DC
  Administered 2012-12-23: 1.5 mg via TRANSDERMAL

## 2012-12-23 MED ORDER — FENTANYL CITRATE 0.05 MG/ML IJ SOLN
INTRAMUSCULAR | Status: AC
  Filled 2012-12-23: qty 2

## 2012-12-23 MED ORDER — LANOLIN HYDROUS EX OINT: 1.0000 "application " | TOPICAL_OINTMENT | CUTANEOUS | Status: DC | PRN

## 2012-12-23 MED ORDER — MAGNESIUM SULFATE 50 % IJ SOLN
40.0000 g | INTRAVENOUS | Status: DC | PRN
  Administered 2012-12-23: 2 g via INTRAVENOUS

## 2012-12-23 MED ORDER — OXYTOCIN 10 UNIT/ML IJ SOLN
INTRAMUSCULAR | Status: AC
  Filled 2012-12-23: qty 4

## 2012-12-23 MED ORDER — NALBUPHINE HCL 10 MG/ML IJ SOLN
5.0000 mg | INTRAMUSCULAR | Status: DC | PRN
  Filled 2012-12-23: qty 1

## 2012-12-23 MED ORDER — BUPIVACAINE HCL (PF) 0.5 % IJ SOLN
INTRAMUSCULAR | Status: DC | PRN
  Administered 2012-12-23: 27 mL

## 2012-12-23 MED ORDER — SODIUM CHLORIDE 0.9 % IJ SOLN: 3.0000 mL | INTRAMUSCULAR | Status: DC | PRN

## 2012-12-23 MED ORDER — MEPERIDINE HCL 25 MG/ML IJ SOLN: 6.2500 mg | INTRAMUSCULAR | Status: DC | PRN

## 2012-12-23 MED ORDER — NALBUPHINE HCL 10 MG/ML IJ SOLN: 5.0000 mg | INTRAMUSCULAR | Status: DC | PRN

## 2012-12-23 MED ORDER — BUPIVACAINE IN DEXTROSE 0.75-8.25 % IT SOLN
INTRATHECAL | Status: DC | PRN
  Administered 2012-12-23: 11 mg via INTRATHECAL

## 2012-12-23 MED ORDER — OXYTOCIN 40 UNITS IN LACTATED RINGERS INFUSION - SIMPLE MED: 62.5000 mL/h | INTRAVENOUS | Status: AC

## 2012-12-23 MED ORDER — ONDANSETRON HCL 4 MG/2ML IJ SOLN: 4.0000 mg | Freq: Three times a day (TID) | INTRAMUSCULAR | Status: DC | PRN

## 2012-12-23 MED ORDER — LACTATED RINGERS IV SOLN
INTRAVENOUS | Status: DC
  Administered 2012-12-24 (×2): via INTRAVENOUS

## 2012-12-23 MED ORDER — PROMETHAZINE HCL 25 MG/ML IJ SOLN: 6.2500 mg | INTRAMUSCULAR | Status: DC | PRN

## 2012-12-23 MED ORDER — DIBUCAINE 1 % RE OINT
1.0000 "application " | TOPICAL_OINTMENT | RECTAL | Status: DC | PRN
  Filled 2012-12-23: qty 28

## 2012-12-23 MED ORDER — MIDAZOLAM HCL 2 MG/2ML IJ SOLN: 0.5000 mg | Freq: Once | INTRAMUSCULAR | Status: DC | PRN

## 2012-12-23 MED ORDER — ONDANSETRON HCL 4 MG/2ML IJ SOLN
4.0000 mg | INTRAMUSCULAR | Status: DC | PRN
  Filled 2012-12-23: qty 2

## 2012-12-23 MED ORDER — LACTATED RINGERS IV SOLN
INTRAVENOUS | Status: DC | PRN
  Administered 2012-12-23: 14:00:00 via INTRAVENOUS

## 2012-12-23 MED ORDER — LACTATED RINGERS IV SOLN
INTRAVENOUS | Status: DC
  Administered 2012-12-23: 12:00:00 via INTRAVENOUS

## 2012-12-23 MED ORDER — NALOXONE HCL 1 MG/ML IJ SOLN
1.0000 ug/kg/h | INTRAMUSCULAR | Status: DC | PRN
  Filled 2012-12-23: qty 2

## 2012-12-23 MED ORDER — NALOXONE HCL 0.4 MG/ML IJ SOLN: 0.4000 mg | INTRAMUSCULAR | Status: DC | PRN

## 2012-12-23 MED ORDER — SIMETHICONE 80 MG PO CHEW
80.0000 mg | CHEWABLE_TABLET | ORAL | Status: DC | PRN
  Administered 2012-12-24 – 2012-12-25 (×4): 80 mg via ORAL

## 2012-12-23 MED ORDER — TETANUS-DIPHTH-ACELL PERTUSSIS 5-2.5-18.5 LF-MCG/0.5 IM SUSP
0.5000 mL | Freq: Once | INTRAMUSCULAR | Status: DC
  Filled 2012-12-23: qty 0.5

## 2012-12-23 MED ORDER — MEASLES, MUMPS & RUBELLA VAC ~~LOC~~ INJ
0.5000 mL | INJECTION | Freq: Once | SUBCUTANEOUS | Status: DC
  Filled 2012-12-23: qty 0.5

## 2012-12-23 MED ORDER — DIPHENHYDRAMINE HCL 50 MG/ML IJ SOLN: 12.5000 mg | INTRAMUSCULAR | Status: DC | PRN

## 2012-12-23 MED ORDER — KETOROLAC TROMETHAMINE 30 MG/ML IJ SOLN: 15.0000 mg | Freq: Once | INTRAMUSCULAR | Status: DC | PRN

## 2012-12-23 MED ORDER — SCOPOLAMINE 1 MG/3DAYS TD PT72
1.0000 | MEDICATED_PATCH | Freq: Once | TRANSDERMAL | Status: DC
  Filled 2012-12-23: qty 1

## 2012-12-23 MED ORDER — BUPIVACAINE HCL (PF) 0.5 % IJ SOLN
INTRAMUSCULAR | Status: AC
  Filled 2012-12-23: qty 30

## 2012-12-23 MED ORDER — METOCLOPRAMIDE HCL 5 MG/ML IJ SOLN: 10.0000 mg | Freq: Three times a day (TID) | INTRAMUSCULAR | Status: DC | PRN

## 2012-12-23 MED ORDER — ONDANSETRON HCL 4 MG/2ML IJ SOLN
INTRAMUSCULAR | Status: DC | PRN
  Administered 2012-12-23: 4 mg via INTRAVENOUS

## 2012-12-23 MED ORDER — SCOPOLAMINE 1 MG/3DAYS TD PT72
MEDICATED_PATCH | TRANSDERMAL | Status: AC
  Filled 2012-12-23: qty 1

## 2012-12-23 MED ORDER — ONDANSETRON HCL 4 MG/2ML IJ SOLN
4.0000 mg | Freq: Three times a day (TID) | INTRAMUSCULAR | Status: DC | PRN
  Administered 2012-12-23: 4 mg via INTRAVENOUS

## 2012-12-23 MED ORDER — OXYTOCIN 10 UNIT/ML IJ SOLN
40.0000 [IU] | INTRAVENOUS | Status: DC | PRN
  Administered 2012-12-23: 40 [IU] via INTRAVENOUS

## 2012-12-23 MED ORDER — PHENYLEPHRINE HCL 10 MG/ML IJ SOLN
INTRAMUSCULAR | Status: DC | PRN
  Administered 2012-12-23: 80 ug via INTRAVENOUS
  Administered 2012-12-23: 40 ug via INTRAVENOUS

## 2012-12-23 MED ORDER — MAGNESIUM SULFATE 40 G IN LACTATED RINGERS - SIMPLE
2.0000 g/h | INTRAVENOUS | Status: AC
  Administered 2012-12-23 – 2012-12-24 (×2): 2 g/h via INTRAVENOUS
  Filled 2012-12-23 (×2): qty 500

## 2012-12-23 MED ORDER — IBUPROFEN 600 MG PO TABS
600.0000 mg | ORAL_TABLET | Freq: Four times a day (QID) | ORAL | Status: DC
  Administered 2012-12-23 – 2012-12-25 (×6): 600 mg via ORAL
  Filled 2012-12-23 (×6): qty 1

## 2012-12-23 MED ORDER — MORPHINE SULFATE (PF) 0.5 MG/ML IJ SOLN
INTRAMUSCULAR | Status: DC | PRN
  Administered 2012-12-23: .15 mg via INTRATHECAL

## 2012-12-23 MED ORDER — SCOPOLAMINE 1 MG/3DAYS TD PT72: 1.0000 | MEDICATED_PATCH | Freq: Once | TRANSDERMAL | Status: DC

## 2012-12-23 MED ORDER — CITRIC ACID-SODIUM CITRATE 334-500 MG/5ML PO SOLN
30.0000 mL | Freq: Once | ORAL | Status: AC
  Administered 2012-12-23: 30 mL via ORAL
  Filled 2012-12-23: qty 15

## 2012-12-23 MED ORDER — OXYCODONE-ACETAMINOPHEN 5-325 MG PO TABS
1.0000 | ORAL_TABLET | ORAL | Status: DC | PRN
  Administered 2012-12-24: 1 via ORAL
  Administered 2012-12-25 (×2): 2 via ORAL
  Filled 2012-12-23: qty 2
  Filled 2012-12-23: qty 1

## 2012-12-23 MED ORDER — DIPHENHYDRAMINE HCL 25 MG PO CAPS
25.0000 mg | ORAL_CAPSULE | Freq: Four times a day (QID) | ORAL | Status: DC | PRN
  Filled 2012-12-23: qty 1

## 2012-12-23 MED ORDER — MENTHOL 3 MG MT LOZG
1.0000 | LOZENGE | OROMUCOSAL | Status: DC | PRN
  Filled 2012-12-23: qty 9

## 2012-12-23 MED ORDER — MEPERIDINE HCL 25 MG/ML IJ SOLN
6.2500 mg | INTRAMUSCULAR | Status: DC | PRN
  Administered 2012-12-23: 12.5 mg via INTRAVENOUS

## 2012-12-23 MED ORDER — WITCH HAZEL-GLYCERIN EX PADS: 1.0000 "application " | MEDICATED_PAD | CUTANEOUS | Status: DC | PRN

## 2012-12-23 MED ORDER — ONDANSETRON HCL 4 MG PO TABS
4.0000 mg | ORAL_TABLET | ORAL | Status: DC | PRN
Start: 2012-12-23 — End: 2012-12-25

## 2012-12-23 MED ORDER — CEFAZOLIN SODIUM-DEXTROSE 2-3 GM-% IV SOLR
2.0000 g | INTRAVENOUS | Status: AC
  Administered 2012-12-23: 2 g via INTRAVENOUS
  Filled 2012-12-23: qty 50

## 2012-12-23 MED ORDER — MORPHINE SULFATE 0.5 MG/ML IJ SOLN
INTRAMUSCULAR | Status: AC
  Filled 2012-12-23: qty 10

## 2012-12-23 MED ORDER — CALCIUM CARBONATE ANTACID 500 MG PO CHEW
1.0000 | CHEWABLE_TABLET | Freq: Every day | ORAL | Status: DC | PRN
  Filled 2012-12-23: qty 1

## 2012-12-23 MED ORDER — PHENYLEPHRINE 40 MCG/ML (10ML) SYRINGE FOR IV PUSH (FOR BLOOD PRESSURE SUPPORT)
PREFILLED_SYRINGE | INTRAVENOUS | Status: AC
  Filled 2012-12-23: qty 5

## 2012-12-23 MED ORDER — LACTATED RINGERS IV SOLN
INTRAVENOUS | Status: DC | PRN
  Administered 2012-12-23 (×2): via INTRAVENOUS

## 2012-12-23 SURGICAL SUPPLY — 37 items
BENZOIN TINCTURE PRP APPL 2/3 (GAUZE/BANDAGES/DRESSINGS) ×2 IMPLANT
CLAMP CORD UMBIL (MISCELLANEOUS) ×6 IMPLANT
CLOTH BEACON ORANGE TIMEOUT ST (SAFETY) ×2 IMPLANT
DRAPE LG THREE QUARTER DISP (DRAPES) ×2 IMPLANT
DRSG OPSITE POSTOP 4X10 (GAUZE/BANDAGES/DRESSINGS) ×2 IMPLANT
DURAPREP 26ML APPLICATOR (WOUND CARE) ×2 IMPLANT
ELECT REM PT RETURN 9FT ADLT (ELECTROSURGICAL) ×2
ELECTRODE REM PT RTRN 9FT ADLT (ELECTROSURGICAL) ×1 IMPLANT
EXTRACTOR VACUUM M CUP 4 TUBE (SUCTIONS) IMPLANT
GLOVE BIO SURGEON STRL SZ7 (GLOVE) ×2 IMPLANT
GLOVE BIOGEL PI IND STRL 7.0 (GLOVE) ×1 IMPLANT
GLOVE BIOGEL PI INDICATOR 7.0 (GLOVE) ×1
GOWN STRL REIN XL XLG (GOWN DISPOSABLE) ×6 IMPLANT
KIT ABG SYR 3ML LUER SLIP (SYRINGE) ×6 IMPLANT
NEEDLE HYPO 22GX1.5 SAFETY (NEEDLE) ×2 IMPLANT
NEEDLE HYPO 25X5/8 SAFETYGLIDE (NEEDLE) ×2 IMPLANT
NS IRRIG 1000ML POUR BTL (IV SOLUTION) ×2 IMPLANT
PACK C SECTION WH (CUSTOM PROCEDURE TRAY) ×2 IMPLANT
PAD ABD 7.5X8 STRL (GAUZE/BANDAGES/DRESSINGS) ×2 IMPLANT
PAD OB MATERNITY 4.3X12.25 (PERSONAL CARE ITEMS) ×2 IMPLANT
RTRCTR C-SECT PINK 25CM LRG (MISCELLANEOUS) ×2 IMPLANT
STAPLER VISISTAT 35W (STAPLE) IMPLANT
STRIP CLOSURE SKIN 1/2X4 (GAUZE/BANDAGES/DRESSINGS) ×2 IMPLANT
SUT PDS AB 0 CT1 27 (SUTURE) IMPLANT
SUT PDS AB 0 CTX 36 PDP370T (SUTURE) IMPLANT
SUT PLAIN 2 0 XLH (SUTURE) ×2 IMPLANT
SUT VIC AB 0 CT1 36 (SUTURE) ×4 IMPLANT
SUT VIC AB 0 CTX 36 (SUTURE) ×2
SUT VIC AB 0 CTX36XBRD ANBCTRL (SUTURE) ×2 IMPLANT
SUT VIC AB 2-0 CT1 27 (SUTURE) ×1
SUT VIC AB 2-0 CT1 TAPERPNT 27 (SUTURE) ×1 IMPLANT
SUT VIC AB 4-0 KS 27 (SUTURE) ×2 IMPLANT
SYR 30ML LL (SYRINGE) ×2 IMPLANT
TAPE CLOTH SURG 4X10 WHT LF (GAUZE/BANDAGES/DRESSINGS) ×2 IMPLANT
TOWEL OR 17X24 6PK STRL BLUE (TOWEL DISPOSABLE) ×2 IMPLANT
TRAY FOLEY CATH 14FR (SET/KITS/TRAYS/PACK) ×2 IMPLANT
WATER STERILE IRR 1000ML POUR (IV SOLUTION) ×2 IMPLANT

## 2012-12-23 NOTE — OR Nursing (Signed)
No Arterial blood collected from baby"A"

## 2012-12-23 NOTE — Anesthesia Postprocedure Evaluation (Signed)
Anesthesia Post Note  Patient: Tina Jennings  Procedure(s) Performed: Procedure(s) (LRB): PRIMARY CESAREAN SECTION with delivery of Triplets (N/A)  Anesthesia type: Spinal  Patient location: PACU  Post pain: Pain level controlled  Post assessment: Post-op Vital signs reviewed  Last Vitals:  Filed Vitals:   12/23/12 1445  BP: 117/72  Pulse:   Temp: 36.6 C  Resp:     Post vital signs: Reviewed  Level of consciousness: awake  Complications: No apparent anesthesia complications

## 2012-12-23 NOTE — MAU Note (Signed)
Magnesium sulfate started 4 gm bolus. Explained to pt and she verbalizes understanding.

## 2012-12-23 NOTE — MAU Note (Signed)
PT presents from clinic for [facility] evaluation.

## 2012-12-23 NOTE — Transfer of Care (Signed)
Immediate Anesthesia Transfer of Care Note  Patient: Tina Jennings  Procedure(s) Performed: Procedure(s): PRIMARY CESAREAN SECTION with delivery of Triplets (N/A)  Patient Location: PACU  Anesthesia Type:Spinal  Level of Consciousness: awake, alert , oriented and patient cooperative  Airway & Oxygen Therapy: Patient Spontanous Breathing  Post-op Assessment: Report given to PACU RN and Post -op Vital signs reviewed and stable  Post vital signs: Reviewed and stable  Complications: No apparent anesthesia complications

## 2012-12-23 NOTE — Progress Notes (Signed)
Taken top NICU per hospital bed. Pt was able to touch all three babies with husband at side/ WAs in the NICU for .

## 2012-12-23 NOTE — H&P (Signed)
Obstetric History and Physical  Tina Jennings is a 27 y.o. G1P0 with triplet gestation at [redacted]w[redacted]d scheduled for cesarean delivery secondary to preeclampsia with laboratory values consistent with evolving HELLP syndrome.  Please see labs below.  Patient denies headaches, visual symptoms, RUQ/epigastric pain. She has been NPO since 0700.  Prenatal Course Source of Care:[facility] Patient Active Problem List   Diagnosis Date Noted  . Severe preeclampsia, antepartum 12/20/2012  . Trichorionic Triamniotic pregnancy 10/11/2012  . Single artery and vein of umbilical cord 10/11/2012  . Rh negative status during pregnancy, antepartum 10/11/2012  . Pregnancy with history of infertility in second trimester 10/11/2012  . Migraine 10/11/2012  She plans to breastfeed.  She desires vasectomy for postpartum contraception.   Prenatal labs and studies: ABO, Rh: --/--/A NEG (08/22 1010) Antibody: POS (08/22 1010) Rubella: Immune (03/25 0000) RPR: NON REAC (07/25 0850)  HBsAg: Negative (03/25 0000)  HIV: NON REACTIVE (07/25 0850)  GBS: Unknown 1 hr Glucola  133 Genetic screening normal x 3 Anatomy US normal x 3  Prenatal Transfer Tool  Maternal Diabetes: No Genetic Screening: Normal Maternal Ultrasounds/Referrals: Abnormal:  Findings:   Other: Single umbilical artery of one of the triplets Fetal Ultrasounds or other Referrals:  None Maternal Substance Abuse:  No Significant Maternal Medications:  None Significant Maternal Lab Results: Lab values include: Other: Evolving HELLP syndrome  Past Medical History  Diagnosis Date  . Arthritis   . Low back pain   . Migraines   . Infertility     History reviewed. No pertinent past surgical history.  OB History  Gravida Para Term Preterm AB SAB TAB Ectopic Multiple Living  1             # Outcome Date GA Lbr Len/2nd Weight Sex Delivery Anes PTL Lv  1 CUR               History   Social History  . Marital Status: Married    Spouse  Name: N/A    Number of Children: N/A  . Years of Education: N/A   Occupational History  . Nurse [employer]   Social History Main Topics  . Smoking status: Never Smoker   . Smokeless tobacco: Never Used  . Alcohol Use: Yes  . Drug Use: No  . Sexual Activity: Yes    Partners: Male   Other Topics Concern  . None   Social History Narrative  . None    Family History  Problem Relation Age of Onset  . Diabetes Father   . Hyperlipidemia Father   . Hypertension Father   . Cancer Maternal Grandmother   . Heart disease Maternal Grandmother   . Stroke Maternal Grandmother   . Heart disease Maternal Grandfather   . Stroke Maternal Grandfather   . Heart disease Paternal Grandfather   . Diabetes Paternal Grandfather   . Stroke Paternal Grandfather     Prescriptions prior to admission  Medication Sig Dispense Refill  . acetaminophen (TYLENOL) 325 MG tablet Take 650 mg by mouth every 6 (six) hours as needed for pain.      . calcium carbonate (TUMS - DOSED IN MG ELEMENTAL CALCIUM) 500 MG chewable tablet Chew 1 tablet by mouth daily as needed for heartburn.      . docusate sodium (COLACE) 100 MG capsule Take 100 mg by mouth daily as needed for constipation.       . Prenatal Vit-Fe Fumarate-FA (PRENATAL MULTIVITAMIN) TABS Take 1 tablet by mouth daily.      Marland Kitchen  ranitidine (ZANTAC) 150 MG tablet Take 150 mg by mouth 2 (two) times daily as needed for heartburn.        No Known Allergies  Review of Systems: Negative except for what is mentioned in HPI.  Physical Exam: BP 150/97  Pulse 91  Resp 18  Ht 5\' 4"  (1.626 m)  Wt 210 lb (95.255 kg)  BMI 36.03 kg/m2 GENERAL: Well-developed, well-nourished female in no acute distress.  LUNGS: Clear to auscultation bilaterally.  HEART: Regular rate and rhythm. ABDOMEN: Soft, nontender, nondistended, gravid. EXTREMITIES: Nontender, no edema, 2+ distal pulses. Cervical Exam: Deferred  Active fetuses; BPP 8/8 x 3 Contractions: None    Pertinent Labs/Studies:   Results for orders placed during the hospital encounter of 12/23/12 (from the past 336 hour(s))  URINALYSIS, ROUTINE W REFLEX MICROSCOPIC   Collection Time    12/23/12  9:40 AM      Result Value Range   Color, Urine YELLOW  YELLOW   APPearance CLEAR  CLEAR   Specific Gravity, Urine 1.015  1.005 - 1.030   pH 7.0  5.0 - 8.0   Glucose, UA NEGATIVE  NEGATIVE mg/dL   Hgb urine dipstick TRACE (*) NEGATIVE   Bilirubin Urine NEGATIVE  NEGATIVE   Ketones, ur NEGATIVE  NEGATIVE mg/dL   Protein, ur 30 (*) NEGATIVE mg/dL   Urobilinogen, UA 0.2  0.0 - 1.0 mg/dL   Nitrite NEGATIVE  NEGATIVE   Leukocytes, UA NEGATIVE  NEGATIVE  URINE MICROSCOPIC-ADD ON   Collection Time    12/23/12  9:40 AM      Result Value Range   Squamous Epithelial / LPF RARE  RARE   WBC, UA 0-2  <3 WBC/hpf   Bacteria, UA RARE  RARE  CBC   Collection Time    12/23/12 10:10 AM      Result Value Range   WBC 10.1  4.0 - 10.5 K/uL   RBC 3.75 (*) 3.87 - 5.11 MIL/uL   Hemoglobin 11.6 (*) 12.0 - 15.0 g/dL   HCT 16.1 (*) 09.6 - 04.5 %   MCV 91.5  78.0 - 100.0 fL   MCH 30.9  26.0 - 34.0 pg   MCHC 33.8  30.0 - 36.0 g/dL   RDW 40.9  81.1 - 91.4 %   Platelets 108 (*) 150 - 400 K/uL  LACTATE DEHYDROGENASE   Collection Time    12/23/12 10:10 AM      Result Value Range   LDH 306 (*) 94 - 250 U/L  COMPREHENSIVE METABOLIC PANEL   Collection Time    12/23/12 10:10 AM      Result Value Range   Sodium 137  135 - 145 mEq/L   Potassium 3.6  3.5 - 5.1 mEq/L   Chloride 103  96 - 112 mEq/L   CO2 22  19 - 32 mEq/L   Glucose, Bld 76  70 - 99 mg/dL   BUN 7  6 - 23 mg/dL   Creatinine, Ser 7.82  0.50 - 1.10 mg/dL   Calcium 8.1 (*) 8.4 - 10.5 mg/dL   Total Protein 5.1 (*) 6.0 - 8.3 g/dL   Albumin 1.9 (*) 3.5 - 5.2 g/dL   AST 58 (*) 0 - 37 U/L   ALT 46 (*) 0 - 35 U/L   Alkaline Phosphatase 278 (*) 39 - 117 U/L   Total Bilirubin 0.7  0.3 - 1.2 mg/dL   GFR calc non Af Amer >90  >90 mL/min   GFR calc Af  Amer >  90  >90 mL/min  TYPE AND SCREEN   Collection Time    12/23/12 10:10 AM      Result Value Range   ABO/RH(D) A NEG     Antibody Screen POS     Sample Expiration 12/26/2012     Antibody Identification PENDING     DAT, IgG PENDING    PREPARE RBC (CROSSMATCH)   Collection Time    12/23/12 11:17 AM      Result Value Range   Order Confirmation ORDER PROCESSED BY BLOOD BANK    Results for orders placed in visit on 12/22/12 (from the past 336 hour(s))  CBC   Collection Time    12/22/12  3:32 PM      Result Value Range   WBC 9.4  4.0 - 10.5 K/uL   RBC 3.76 (*) 3.87 - 5.11 MIL/uL   Hemoglobin 11.3 (*) 12.0 - 15.0 g/dL   HCT 19.1 (*) 47.8 - 29.5 %   MCV 91.5  78.0 - 100.0 fL   MCH 30.1  26.0 - 34.0 pg   MCHC 32.8  30.0 - 36.0 g/dL   RDW 62.1  30.8 - 65.7 %   Platelets 138 (*) 150 - 400 K/uL  COMPREHENSIVE METABOLIC PANEL   Collection Time    12/22/12  3:32 PM      Result Value Range   Sodium 138  135 - 145 mEq/L   Potassium 4.1  3.5 - 5.3 mEq/L   Chloride 105  96 - 112 mEq/L   CO2 26  19 - 32 mEq/L   Glucose, Bld 75  70 - 99 mg/dL   BUN 7  6 - 23 mg/dL   Creat 8.46  9.62 - 9.52 mg/dL   Total Bilirubin 0.9  0.3 - 1.2 mg/dL   Alkaline Phosphatase 244 (*) 39 - 117 U/L   AST 56 (*) 0 - 37 U/L   ALT 42 (*) 0 - 35 U/L   Total Protein 5.0 (*) 6.0 - 8.3 g/dL   Albumin 2.6 (*) 3.5 - 5.2 g/dL   Calcium 8.4  8.4 - 84.1 mg/dL  Results for orders placed in visit on 12/20/12 (from the past 336 hour(s))  CBC   Collection Time    12/20/12  4:00 PM      Result Value Range   WBC 12.8 (*) 4.0 - 10.5 K/uL   RBC 4.06  3.87 - 5.11 MIL/uL   Hemoglobin 12.6  12.0 - 15.0 g/dL   HCT 32.4  40.1 - 02.7 %   MCV 90.4  78.0 - 100.0 fL   MCH 31.0  26.0 - 34.0 pg   MCHC 34.3  30.0 - 36.0 g/dL   RDW 25.3  66.4 - 40.3 %   Platelets 145 (*) 150 - 400 K/uL  COMPREHENSIVE METABOLIC PANEL   Collection Time    12/20/12  4:00 PM      Result Value Range   Sodium 137  135 - 145 mEq/L   Potassium 4.0   3.5 - 5.3 mEq/L   Chloride 106  96 - 112 mEq/L   CO2 23  19 - 32 mEq/L   Glucose, Bld 74  70 - 99 mg/dL   BUN 6  6 - 23 mg/dL   Creat 4.74  2.59 - 5.63 mg/dL   Total Bilirubin 0.7  0.3 - 1.2 mg/dL   Alkaline Phosphatase 238 (*) 39 - 117 U/L   AST 42 (*) 0 - 37 U/L   ALT 34  0 - 35 U/L   Total Protein 5.1 (*) 6.0 - 8.3 g/dL   Albumin 2.7 (*) 3.5 - 5.2 g/dL   Calcium 8.5  8.4 - 16.1 mg/dL  Results for orders placed in visit on 12/16/12 (from the past 336 hour(s))  PROTEIN, URINE, 24 HOUR   Collection Time    12/16/12  8:27 AM      Result Value Range   Protein, Urine 22     Protein, 24H Urine 440 (*) 50 - 100 mg/day  Results for orders placed in visit on 12/13/12 (from the past 336 hour(s))  CBC   Collection Time    12/13/12  4:28 PM      Result Value Range   WBC 8.8  4.0 - 10.5 K/uL   RBC 3.81 (*) 3.87 - 5.11 MIL/uL   Hemoglobin 11.5 (*) 12.0 - 15.0 g/dL   HCT 09.6 (*) 04.5 - 40.9 %   MCV 91.9  78.0 - 100.0 fL   MCH 30.2  26.0 - 34.0 pg   MCHC 32.9  30.0 - 36.0 g/dL   RDW 81.1  91.4 - 78.2 %   Platelets 148 (*) 150 - 400 K/uL  COMPREHENSIVE METABOLIC PANEL   Collection Time    12/13/12  4:28 PM      Result Value Range   Sodium 137  135 - 145 mEq/L   Potassium 3.4 (*) 3.5 - 5.3 mEq/L   Chloride 105  96 - 112 mEq/L   CO2 21  19 - 32 mEq/L   Glucose, Bld 117 (*) 70 - 99 mg/dL   BUN 6  6 - 23 mg/dL   Creat 9.56  2.13 - 0.86 mg/dL   Total Bilirubin 0.8  0.3 - 1.2 mg/dL   Alkaline Phosphatase 204 (*) 39 - 117 U/L   AST 28  0 - 37 U/L   ALT 19  0 - 35 U/L   Total Protein 5.3 (*) 6.0 - 8.3 g/dL   Albumin 2.7 (*) 3.5 - 5.2 g/dL   Calcium 8.5  8.4 - 57.8 mg/dL  PROTEIN / CREATININE RATIO, URINE   Collection Time    12/13/12  4:32 PM      Result Value Range   Creatinine, Urine 211.1     Total Protein, Urine 55     PROTEIN CREATININE RATIO 0.26 (*) <0.15  CREATININE CLEARANCE, URINE, 24 HOUR   Collection Time    12/16/12  8:27 AM      Result Value Range   Creatinine  0.79  0.50 - 1.10 mg/dL   Creatinine, Urine 46.9     Creatinine, 24H Ur 1287  700 - 1800 mg/day   Creatinine Clearance 113  75 - 115 mL/min     Assessment and Plan: Tina Jennings is a 27 y.o. G1P0 at [redacted]w[redacted]d being admitted for primary cesarean section for preeclampsia, evolving HELLP, triplet gestation at [redacted]w[redacted]d.  She is s/p betamethasone on 8/18 and 8/19.  Plan was discussed with Dr. Alpha Gula (Maternal Fetal Medicine).   The risks of cesarean section discussed with the patient included but were not limited to: bleeding which may require transfusion or reoperation; infection which may require antibiotics; injury to bowel, bladder, ureters or other surrounding organs; injury to the fetus; need for additional procedures including hysterectomy in the event of a life-threatening hemorrhage; placental abnormalities wth subsequent pregnancies, incisional problems, thromboembolic phenomenon and other postoperative/anesthesia complications. The patient concurred with the proposed plan, giving informed written consent for the procedure.  Patient has been NPO since 0700 she will remain NPO for procedure. Anesthesia and OR aware. Preoperative prophylactic antibiotics and SCDs ordered on call to the OR.  She is typed and crossmatched for two units. Magnesium sulfate 4 g IV load and 2 g/hour ordered.  To OR at 1330 as scheduled.   Jaynie Collins, MD, FACOG Attending Obstetrician & Gynecologist Faculty Practice, [facility]

## 2012-12-23 NOTE — Anesthesia Procedure Notes (Signed)
Spinal  Patient location during procedure: OR Start time: 12/23/2012 1:41 PM Staffing Anesthesiologist: Angus Seller., Harrell Gave. Performed by: anesthesiologist  Preanesthetic Checklist Completed: patient identified, site marked, surgical consent, pre-op evaluation, timeout performed, IV checked, risks and benefits discussed and monitors and equipment checked Spinal Block Patient position: sitting Prep: DuraPrep Patient monitoring: heart rate, cardiac monitor, continuous pulse ox and blood pressure Approach: midline Location: L3-4 Injection technique: single-shot Needle Needle type: Sprotte  Needle gauge: 24 G Needle length: 9 cm Assessment Sensory level: T4 Additional Notes Patient identified.  Risk benefits discussed including failed block, incomplete pain control, headache, nerve damage, paralysis, blood pressure changes, nausea, vomiting, reactions to medication both toxic or allergic, and postpartum back pain.  Patient expressed understanding and wished to proceed.  All questions were answered.  Sterile technique used throughout procedure.  CSF was clear.  No parasthesia or other complications.  Please see nursing notes for vital signs.

## 2012-12-23 NOTE — Progress Notes (Signed)
12/23/12 1300  Clinical Encounter Type  Visited With Health care provider;Patient not available Johnnye Sima, RN, Neurosurgeon)   Aware of pt's surgery and planned AICU and NICU stays.  Spiritual Care will follow for support, but please also page as needed for pt/family:  [phone].  Thank you!  [address] [place], [place] [phone]

## 2012-12-23 NOTE — MAU Note (Signed)
Mag bolus finished and mag infusing at 2grams/hr.

## 2012-12-23 NOTE — Anesthesia Postprocedure Evaluation (Signed)
  Anesthesia Post-op Note  Anesthesia Post Note  Patient: Tina Jennings  Procedure(s) Performed: Procedure(s) (LRB): PRIMARY CESAREAN SECTION with delivery of Triplets (N/A)  Anesthesia type: Spinal  Patient location: AICU  Post pain: Pain level controlled  Post assessment: Post-op Vital signs reviewed  Last Vitals:  Filed Vitals:   12/23/12 1658  BP: 123/72  Pulse: 80  Temp: 37 C  Resp: 18    Post vital signs: Reviewed  Level of consciousness: awake  Complications: No apparent anesthesia complications

## 2012-12-23 NOTE — Anesthesia Preprocedure Evaluation (Signed)
Anesthesia Evaluation  Patient identified by MRN, date of birth, ID band Patient awake    Reviewed: Allergy & Precautions, H&P , NPO status , Patient's Chart, lab work & pertinent test results  Airway Mallampati: III      Dental no notable dental hx.    Pulmonary neg pulmonary ROS,  breath sounds clear to auscultation  Pulmonary exam normal       Cardiovascular Exercise Tolerance: Good hypertension, negative cardio ROS  Rhythm:regular Rate:Normal     Neuro/Psych  Headaches, negative neurological ROS  negative psych ROS   GI/Hepatic negative GI ROS, Neg liver ROS,   Endo/Other  negative endocrine ROS  Renal/GU negative Renal ROS  negative genitourinary   Musculoskeletal   Abdominal Normal abdominal exam  (+)   Peds  Hematology negative hematology ROS (+)   Anesthesia Other Findings Arthritis     Low back pain        Migraines     Infertility    Reproductive/Obstetrics (+) Pregnancy                           Anesthesia Physical Anesthesia Plan  ASA: III and emergent  Anesthesia Plan: Spinal   Post-op Pain Management:    Induction:   Airway Management Planned:   Additional Equipment:   Intra-op Plan:   Post-operative Plan:   Informed Consent: I have reviewed the patients History and Physical, chart, labs and discussed the procedure including the risks, benefits and alternatives for the proposed anesthesia with the patient or authorized representative who has indicated his/her understanding and acceptance.     Plan Discussed with: Anesthesiologist, CRNA and Surgeon  Anesthesia Plan Comments:         Anesthesia Quick Evaluation

## 2012-12-23 NOTE — Progress Notes (Signed)
Bernette Seeman  was seen today for an ultrasound appointment.  See full report in [facility].  Comments: Ms. Colgate returns today for BPPs due to triplet gestation and mild preeclampsia.  BPs in clinic today were 142/96 and 137/91.  Recent CMP showed mildly elevated LFTs and plts that are trending downward.  Based on these findings, would recommend admission for observation, daily testing and at 2-3 x weekly preeclampsia labs.  Would move toward delivery for preeclampsia with severe features.  Impression: Trichorionic/ triamniotic triplet gestation at 24 3/7 weeks Mild preeclampsia with mildly elevated LFTs Active fetuses; BPP 8/8 x 3 Amniotic fluid subjectively increased in baby B; within normal limits in babies A and C  Recommendations: Findings and recommendations discussed with Dr. Macon Large Plan admission to antepartum unit 2-3 x weekly preeclampsia labs Daily fetal testing (if unable to monitor all three fetuses, may need daily BPPs) Would move toward delivery for preeclampsia with severe features (LFTs > 2x normal range, plts < 100, severe range blood pressures, headaches unresponsive to medications) or non reassuring fetal testing.  Alpha Gula, MD

## 2012-12-23 NOTE — Op Note (Signed)
Tina Jennings PROCEDURE DATE: 12/23/2012  PREOPERATIVE DIAGNOSIS: Triplet intrauterine pregnancy at  [redacted]w[redacted]d weeks gestation with severe preeclampsia  POSTOPERATIVE DIAGNOSIS: The same  PROCEDURE:    Low Transverse Cesarean Section  SURGEON:  Dr. Elsie Lincoln  ASSISTANT: Dr. Jaynie Collins and Dr. Hinda Lenis  INDICATIONS: Tina Jennings is a 27 y.o. G1P0 at [redacted]w[redacted]d with triplet gestation and severe preeclampsia.  The risks of cesarean section discussed with the patient included but were not limited to: bleeding which may require transfusion or reoperation; infection which may require antibiotics; injury to bowel, bladder, ureters or other surrounding organs; injury to the fetus; need for additional procedures including hysterectomy in the event of a life-threatening hemorrhage; placental abnormalities wth subsequent pregnancies, incisional problems, thromboembolic phenomenon and other postoperative/anesthesia complications. The patient concurred with the proposed plan, giving informed written consent for the procedure.    FINDINGS:  Baby A--viable female, vertex presentation, Apgars 38,8,  Baby B--viable female, breech presentation, Apgars 103,7;  Baby C--viable female, breech presentation, Apgars 8,9 ;  Clear fluid x3, 3 placentas intact.  Baby A has a 2-vessel umbilical cord; Baby B & C had 3 vessel umbilical cords  Grossly normal uterus, ovaries and fallopian tubes. .   ANESTHESIA:    Spinal  ESTIMATED BLOOD LOSS:  1000 ml  SPECIMENS: Placenta sent to pathology  COMPLICATIONS: None immediate  PROCEDURE IN DETAIL:  The patient received intravenous antibiotics and had sequential compression devices applied to her lower extremities while in the preoperative area.  She was then taken to the operating room where spinal anesthesia was administered and was found to be adequate. She was then placed in a dorsal supine position with a leftward tilt, and prepped and draped in a sterile manner.  A foley  catheter was placed into her bladder and attached to constant gravity.  After an adequate timeout was performed, a Pfannenstiel skin incision was made with scalpel and carried through to the underlying layer of fascia. The fascia was incised in the midline and this incision was extended bilaterally using the Mayo scissors. Kocher clamps were applied to the superior aspect of the fascial incision and the underlying rectus muscles were dissected off bluntly. A similar process was carried out on the inferior aspect of the facial incision. The rectus muscles were separated in the midline bluntly and the peritoneum was entered bluntly.   A transverse hysterotomy was made with a scalpel and extended bilaterally bluntly. The bladder blade was then removed. The 3 infants were successfully delivered.  Each cord was clamped and cut.  Each infant was handed over to awaiting neonatology team. Uterine massage was then administered and the placentas delivered intact. The uterus was cleared of clot and debris.  The hysterotomy was closed with 0 vicryl.  A second imbricating suture of 0-Vicryl was used to reinforce the incision and aid in hemostasis.  The peritoneum and rectus muscles were noted to be hemostatic.  The fascia was closed with 0-Vicryl in a running fashion with good restoration of anatomy.  The subcutaneus tissue was copiously irrigated.  The skin was closed with 4-0 Vicryl in a subcuticular fashion.  Pt tolerated the procedure will.  All counts were correct x2.  Pt went to the recovery room in stable condition.

## 2012-12-24 LAB — CBC
MCH: 30.9 pg (ref 26.0–34.0)
MCV: 91.8 fL (ref 78.0–100.0)
Platelets: 116 10*3/uL — ABNORMAL LOW (ref 150–400)
RDW: 13.2 % (ref 11.5–15.5)
WBC: 12 10*3/uL — ABNORMAL HIGH (ref 4.0–10.5)

## 2012-12-24 LAB — RPR: RPR Ser Ql: NONREACTIVE

## 2012-12-24 MED ORDER — RHO D IMMUNE GLOBULIN 1500 UNIT/2ML IJ SOLN
300.0000 ug | Freq: Once | INTRAMUSCULAR | Status: AC
  Administered 2012-12-24: 300 ug via INTRAVENOUS
  Filled 2012-12-24: qty 2

## 2012-12-24 NOTE — Lactation Note (Signed)
This note was copied from the chart of Tina Saman Cullers. Lactation Consultation Note Initial consultation with this first time mom of triplets. Mom states she wants to exclusively breast feed. Discussed breast feeding basics, discussed breast feeding triplets, positioning, rotating, increasing and maintaining milk supply.  Mom states she did receive fertility treatments for this pregnancy, states her cycles are very irregular/ nonexistent. Mom also states that her breasts grew significantly and got darker during pregnancy. Discussed her risks and strategies to ensure milk supply.  Mom was already provided with NICU book and is keeping a pumping log. Enc mom to continue pumping at least 8 times per day and at least once at night, and to add hand expression 3 to 4 minutes after pumping.  Mom made aware of lactation services and [facility]. Enc mom to call if she has any concerns. Will follow up in NICU to assist with latching babies.    Patient Name: Tina Jennings Today's Date: 12/24/2012 Reason for consult: Initial assessment;NICU baby;Multiple gestation   Maternal Data Formula Feeding for Exclusion: Yes Reason for exclusion: Admission to Intensive Care Unit (ICU) post-partum Infant to breast within first hour of birth: No Breastfeeding delayed due to:: Maternal status;Infant status Has patient been taught Hand Expression?: Yes Does the patient have breastfeeding experience prior to this delivery?: No  Feeding    LATCH Score/Interventions                      Lactation Tools Discussed/Used     Consult Status Consult Status: Follow-up Follow-up type: In-patient    Tina Jennings 12/24/2012, 11:44 AM    

## 2012-12-24 NOTE — Progress Notes (Signed)
Subjective: Postpartum Day 1 : Cesarean Delivery Patient reports really no complaints at all, minimal pain, no nausea, ants to order breakfast.    Objective: Vital signs in last 24 hours: Temp:  [97.8 F (36.6 C)-98.6 F (37 C)] 98.6 F (37 C) (08/23 0400) Pulse Rate:  [79-104] 81 (08/23 0601) Resp:  [12-21] 18 (08/23 0601) BP: (93-150)/(51-98) 120/82 mmHg (08/23 0601) SpO2:  [92 %-100 %] 96 % (08/23 0601) Weight:  [195 lb 11.2 oz (88.769 kg)-212 lb (96.163 kg)] 195 lb 11.2 oz (88.769 kg) (08/23 0500)  Physical Exam:  General: alert, cooperative and no distress Lochia: appropriate Uterine Fundus: firm Incision: appears dry through dressing DVT Evaluation: No evidence of DVT seen on physical exam.  SCD on   Recent Labs  12/23/12 1955 12/24/12 0506  HGB 10.8* 9.4*  HCT 31.7* 27.9*  Platelets 116, stable  Assessment/Plan: Status post Cesarean section. Doing well postoperatively.  Continue current care. Stop Magnesium at 1430, observe for 4 hours then transfer Remove foley  Mico Spark H 12/24/2012, 7:12 AM

## 2012-12-25 LAB — TYPE AND SCREEN
ABO/RH(D): A NEG
Antibody Screen: POSITIVE
Unit division: 0
Unit division: 0

## 2012-12-25 LAB — RH IG WORKUP (INCLUDES ABO/RH)
Gestational Age(Wks): 31.3
Unit division: 0

## 2012-12-25 MED ORDER — IBUPROFEN 600 MG PO TABS: 600.0000 mg | ORAL_TABLET | Freq: Four times a day (QID) | ORAL | Status: DC

## 2012-12-25 MED ORDER — IBUPROFEN 600 MG PO TABS: 600.0000 mg | ORAL_TABLET | Freq: Four times a day (QID) | ORAL | Status: AC | PRN

## 2012-12-25 MED ORDER — OXYCODONE-ACETAMINOPHEN 5-325 MG PO TABS: 1.0000 | ORAL_TABLET | ORAL | Status: DC | PRN

## 2012-12-25 NOTE — Progress Notes (Signed)
Discharge instructions provided to patient and significant other at bedside.  Follow up appointment, medications, activity, when to call the doctor and community resources discussed.  No questions at this time.  Patient left unit in stable condition with all personal belongings.  Osvaldo Angst, RN----------------

## 2012-12-25 NOTE — Discharge Summary (Signed)
Obstetric Discharge Summary Reason for Admission: cesarean section with triplets and severe preeclampsia Prenatal Procedures: ultrasound Intrapartum Procedures: cesarean: low cervical, transverse Postpartum Procedures: none Complications-Operative and Postpartum: none Hemoglobin  Date Value Range Status  12/24/2012 9.4* 12.0 - 15.0 g/dL Final  7/82/9562 13.0   Final     HCT  Date Value Range Status  12/24/2012 27.9* 36.0 - 46.0 % Final  07/26/2012 40   Final    Physical Exam:  Filed Vitals:   12/24/12 1930 12/25/12 0152 12/25/12 0600 12/25/12 0617  BP: 135/87 108/67 122/79   Pulse: 87 64 89   Temp: 98.3 F (36.8 C) 97.7 F (36.5 C) 98.5 F (36.9 C)   TempSrc: Oral Oral    Resp: 20 18 18    Height:      Weight:    189 lb (85.73 kg)  SpO2: 97% 97% 97%     General: alert, cooperative and no distress Lochia: appropriate Uterine Fundus: firm Incision: healing well DVT Evaluation: No evidence of DVT seen on physical exam.  Discharge Diagnoses: Preelampsia and preterm delivery of triplets  Discharge Information: Date: 12/25/2012 Activity: pelvic rest and no heavy lifting Diet: routine Medications: Ibuprofen, Colace and Percocet Condition: improved Instructions: cesareasn section care Discharge to: home Follow-up Information   Follow up with [facility] [place]. Call in 6 weeks.   Specialty:  Obstetrics and Gynecology   Contact information:   [address] [place] [address] [phone]      Newborn Data:   Niccole, Witthuhn [[id number]]  Live born female  Birth Weight: 2 lb 11.7 oz (1240 g) APGAR: 7, 8   Charlea, Nardo [[id number]]  Live born female  Birth Weight: 3 lb 8.4 oz (1600 g) APGAR: 7, 7   Maloni, Musleh [[id number]]  Live born female  Birth Weight: 3 lb 6.7 oz (1550 g) APGAR: 8, 9  Babies in NICU  ARNOLD,JAMES 12/25/2012, 7:39 AM

## 2012-12-26 ENCOUNTER — Encounter (HOSPITAL_COMMUNITY): Payer: Self-pay | Admitting: Obstetrics & Gynecology

## 2012-12-26 ENCOUNTER — Ambulatory Visit (HOSPITAL_COMMUNITY): Payer: 59

## 2012-12-27 ENCOUNTER — Other Ambulatory Visit (HOSPITAL_COMMUNITY): Payer: 59

## 2012-12-28 ENCOUNTER — Ambulatory Visit (HOSPITAL_COMMUNITY): Payer: 59

## 2012-12-29 ENCOUNTER — Encounter: Payer: 59 | Admitting: Obstetrics & Gynecology

## 2012-12-30 ENCOUNTER — Other Ambulatory Visit (HOSPITAL_COMMUNITY): Payer: 59

## 2013-01-03 ENCOUNTER — Ambulatory Visit (HOSPITAL_COMMUNITY): Payer: 59

## 2013-01-04 ENCOUNTER — Encounter: Payer: Self-pay | Admitting: *Deleted

## 2013-01-04 ENCOUNTER — Ambulatory Visit: Payer: Self-pay

## 2013-01-04 NOTE — Lactation Note (Signed)
This note was copied from the chart of Mischele Detter. Lactation Consultation Note    Follow up consult of this mom of now 43 day old triplets, corrected gstation 33 1/7 weeks. I did a creamatocrit on mom's milk, and got an average of 31 cals/oz. I would like to test some just pumped milk from mom, when She and I can get together.   Patient Name: Mariadelaluz Guggenheim AVWUJ'W Date: 01/04/2013     Maternal Data    Feeding Feeding Type: Breast Milk  LATCH Score/Interventions                      Lactation Tools Discussed/Used     Consult Status      Alfred Levins 01/04/2013, 4:47 PM

## 2013-01-05 ENCOUNTER — Ambulatory Visit: Payer: Self-pay

## 2013-01-05 NOTE — Lactation Note (Signed)
This note was copied from the chart of Tina Stephanny Bunton. Lactation Consultation Note     Follow up consult with this mom. She had just pumped fresh milk, and I  Repeated a creamtocrit on her milk. I got a result of 25 cal per ounce from one breast pumping, and 22 cals per ounce from the other breast. I think these values are more reliable thatn yesterdays, because the milk was warm and fresh.   Patient Name: Tina Jennings Today's Date: 01/05/2013 Reason for consult: Follow-up assessment;NICU baby   Maternal Data    Feeding Feeding Type: Breast Milk with Formula added  LATCH Score/Interventions                      Lactation Tools Discussed/Used     Consult Status Consult Status: PRN Follow-up type: In-patient    Leeah Politano Anne 01/05/2013, 4:37 PM    

## 2013-01-06 ENCOUNTER — Other Ambulatory Visit (HOSPITAL_COMMUNITY): Payer: 59

## 2013-01-10 ENCOUNTER — Other Ambulatory Visit (HOSPITAL_COMMUNITY): Payer: 59

## 2013-01-13 ENCOUNTER — Ambulatory Visit (HOSPITAL_COMMUNITY): Payer: 59

## 2013-01-17 ENCOUNTER — Ambulatory Visit (HOSPITAL_COMMUNITY): Payer: 59

## 2013-02-08 ENCOUNTER — Ambulatory Visit (INDEPENDENT_AMBULATORY_CARE_PROVIDER_SITE_OTHER): Payer: 59 | Admitting: Obstetrics & Gynecology

## 2013-02-08 ENCOUNTER — Encounter: Payer: Self-pay | Admitting: Obstetrics & Gynecology

## 2013-02-08 VITALS — BP 122/76 | HR 95 | Resp 16 | Ht 64.0 in | Wt 145.0 lb

## 2013-02-08 DIAGNOSIS — Z6791 Unspecified blood type, Rh negative: Secondary | ICD-10-CM | POA: Insufficient documentation

## 2013-02-08 DIAGNOSIS — Z3009 Encounter for other general counseling and advice on contraception: Secondary | ICD-10-CM

## 2013-02-08 MED ORDER — NORETHINDRONE 0.35 MG PO TABS: 1.0000 | ORAL_TABLET | Freq: Every day | ORAL | Status: DC

## 2013-02-08 NOTE — Progress Notes (Signed)
  Subjective:     Tina Jennings is a 27 y.o. female who presents for a postpartum visit. She is 6 weeks postpartum following a low cervical transverse Cesarean section for triplets and HELLP. I have fully reviewed the prenatal and intrapartum course. The delivery was at 32 gestational weeks. Outcome: primary cesarean section, low transverse incision. Anesthesia: spinal. Postpartum course has been normla for mom. Baby's course has been supervised in the NICU.  One infant is still in the NICU with reflux.  . Babies is feeding by breast. Bleeding no bleeding. Bowel function is normal. Bladder function is normal. Patient is not sexually active. Contraception method is oral progesterone-only contraceptive. Pt considering vasectomy.  Pt given information on Mirena as she is in her 58's.  Postpartum depression screening: negative.  The following portions of the patient's history were reviewed and updated as appropriate: allergies, current medications, past family history, past medical history, past social history, past surgical history and problem list.  Review of Systems Pertinent items are noted in HPI.   Objective:    BP 122/76  Pulse 95  Resp 16  Ht 5\' 4"  (1.626 m)  Wt 145 lb (65.772 kg)  BMI 24.88 kg/m2  Breastfeeding? Yes  General:  alert, cooperative and no distress   Breasts:  inspection negative, no nipple discharge or bleeding, no masses or nodularity palpable  Lungs: clear to auscultation bilaterally  Heart:  non examined  Abdomen: soft, non-tender; bowel sounds normal; no masses,  no organomegaly   Vulva:  not evaluated  Vagina: not evaluated  Cervix:  no evaluated  Corpus: not examined  Adnexa:  not evaluated  Rectal Exam: Not performed.        Assessment:     Nml postpartum exam.   Plan:    1. Contraception: oral progesterone-only contraceptive 2. Considering vasectomy.  Given info on mirena 3. Follow up in: 1 year or as needed.

## 2013-02-08 NOTE — Progress Notes (Signed)
Patient ID: Tina Jennings, female   DOB: 1985/10/25, 27 y.o.   MRN: [id number] Husband is going to have vasectomy.  2 babies home one still in NICU.  Last pap 2/13 h/o abnormal pap in 2009 with colposcopy and then repeats have been normal since.

## 2013-02-12 ENCOUNTER — Encounter: Payer: Self-pay | Admitting: Obstetrics & Gynecology

## 2013-02-12 DIAGNOSIS — Z538 Procedure and treatment not carried out for other reasons: Secondary | ICD-10-CM | POA: Insufficient documentation

## 2013-03-09 ENCOUNTER — Other Ambulatory Visit: Payer: Self-pay

## 2013-03-09 ENCOUNTER — Encounter: Payer: Self-pay | Admitting: Sports Medicine

## 2013-03-29 ENCOUNTER — Emergency Department (INDEPENDENT_AMBULATORY_CARE_PROVIDER_SITE_OTHER)
Admission: EM | Admit: 2013-03-29 | Discharge: 2013-03-29 | Disposition: A | Discharge status: Home / Self Care | Payer: 59 | Source: Home / Self Care | Attending: Family Medicine | Admitting: Family Medicine

## 2013-03-29 ENCOUNTER — Encounter: Payer: Self-pay | Admitting: Emergency Medicine

## 2013-03-29 DIAGNOSIS — R300 Dysuria: Secondary | ICD-10-CM

## 2013-03-29 LAB — POCT URINALYSIS DIP (MANUAL ENTRY)
Bilirubin, UA: NEGATIVE
Blood, UA: NEGATIVE
Nitrite, UA: NEGATIVE
Protein Ur, POC: NEGATIVE
pH, UA: 6 (ref 5–8)

## 2013-03-29 MED ORDER — CEPHALEXIN 500 MG PO CAPS: 500.0000 mg | ORAL_CAPSULE | Freq: Two times a day (BID) | ORAL | Status: DC

## 2013-03-29 NOTE — ED Notes (Signed)
Back pain more predominant on left lower side of back.

## 2013-03-29 NOTE — ED Provider Notes (Signed)
CSN: [id number]     Arrival date & time 03/29/13  1229 History   First MD Initiated Contact with Patient 03/29/13 1321     Chief Complaint  Patient presents with  . Dysuria  . Back Pain     HPI Comments: Patient awoke about 2:30 am with left lower back pain that lasted about 2 hours, associated with frequency and dysuria.  The pain recurred at 11am today, and lasted about 20 to 30 minutes.  She denies hematuria.   She is assymptomatic at present.  No history of kidney stones.  However she has a family history of renal stones:  Mother, maternal grandmother, and sister.  Patient is a 27 y.o. female presenting with back pain. The history is provided by the patient.  Back Pain Pain location: left lower back. Quality:  Stabbing Radiates to:  Does not radiate Pain severity:  Moderate Onset quality:  Sudden Duration:  2 hours Timing:  Constant Progression:  Resolved Chronicity:  New Relieved by:  Nothing Worsened by:  Nothing tried Associated symptoms: no abdominal pain, no abdominal swelling, no bladder incontinence, no bowel incontinence, no dysuria, no fever, no leg pain, no numbness, no paresthesias, no pelvic pain, no perianal numbness, no tingling and no weakness     Past Medical History  Diagnosis Date  . Arthritis   . Low back pain   . Migraines   . Infertility    Past Surgical History  Procedure Laterality Date  . Cesarean section N/A 12/23/2012    Procedure: PRIMARY CESAREAN SECTION with delivery of Triplets;  Surgeon: Lesly Dukes, MD;  Location: [facility];  Service: Obstetrics;  Laterality: N/A;   Family History  Problem Relation Age of Onset  . Diabetes Father   . Hyperlipidemia Father   . Hypertension Father   . Cancer Maternal Grandmother   . Heart disease Maternal Grandmother   . Stroke Maternal Grandmother   . Heart disease Maternal Grandfather   . Stroke Maternal Grandfather   . Heart disease Paternal Grandfather   . Diabetes Paternal Grandfather   . Stroke  Paternal Grandfather    History  Substance Use Topics  . Smoking status: Never Smoker   . Smokeless tobacco: Never Used  . Alcohol Use: Yes   OB History   Grav Para Term Preterm Abortions TAB SAB Ect Mult Living   1 1  1     1 3      Review of Systems  Constitutional: Negative for fever.  Gastrointestinal: Negative for abdominal pain and bowel incontinence.  Genitourinary: Negative for bladder incontinence, dysuria and pelvic pain.  Musculoskeletal: Positive for back pain.  Neurological: Negative for tingling, weakness, numbness and paresthesias.  All other systems reviewed and are negative.    Allergies  Review of patient's allergies indicates no known allergies.  Home Medications   Current Outpatient Rx  Name  Route  Sig  Dispense  Refill  . rizatriptan (MAXALT) 10 MG tablet   Oral   Take 10 mg by mouth as needed for migraine. May repeat in 2 hours if needed         . acetaminophen (TYLENOL) 325 MG tablet   Oral   Take 650 mg by mouth every 6 (six) hours as needed for pain.         . cephALEXin (KEFLEX) 500 MG capsule   Oral   Take 1 capsule (500 mg total) by mouth 2 (two) times daily.   14 capsule   0   .  docusate sodium (COLACE) 100 MG capsule   Oral   Take 100 mg by mouth daily as needed for constipation.          Marland Kitchen ibuprofen (ADVIL,MOTRIN) 600 MG tablet   Oral   Take 1 tablet (600 mg total) by mouth every 6 (six) hours as needed for pain.   30 tablet   0   . norethindrone (ORTHO MICRONOR) 0.35 MG tablet   Oral   Take 1 tablet (0.35 mg total) by mouth daily.   1 Package   11   . Prenatal Vit-Fe Fumarate-FA (PRENATAL MULTIVITAMIN) TABS   Oral   Take 1 tablet by mouth daily.          BP 109/73  Pulse 84  Temp(Src) 97.6 F (36.4 C) (Oral)  Resp 16  Ht 5\' 4"  (1.626 m)  Wt 140 lb (63.504 kg)  BMI 24.02 kg/m2  SpO2 97%  Breastfeeding? Yes Physical Exam Nursing notes and Vital Signs reviewed. Appearance:  Patient appears healthy,  stated age, and in no acute distress Eyes:  Pupils are equal, round, and reactive to light and accomodation.  Extraocular movement is intact.  Conjunctivae are not inflamed  Pharynx:  Normal Neck:  Supple.  No adenopathy Lungs:  Clear to auscultation.  Breath sounds are equal.  Heart:  Regular rate and rhythm without murmurs, rubs, or gallops.  Abdomen:  Nontender without masses or hepatosplenomegaly.  Bowel sounds are present.  No CVA or flank tenderness.  Extremities:  No edema.  No calf tenderness Skin:  No rash present. Back:  Full range of motion. Non tender.   Straight leg raising test is negative.  Sitting knee extension test is negative.  Strength and sensation in the lower extremities is normal.  Patellar and achilles reflexes are normal    ED Course  Procedures  none    Labs Reviewed  URINE CULTURE  POCT URINALYSIS DIP (MANUAL ENTRY) SG >= 1.030, otherwise negative         MDM   1. Dysuria; ?renal stone.  Rule out UTI    Begin Keflex.  Urine culture pending Increase fluid intake.  May take Tylenol for pain. If symptoms become significantly worse during the night or over the weekend, proceed to the local emergency room. Followup with Family Doctor if not improved in one week.     Lattie Haw, MD 03/31/13 2023

## 2013-03-29 NOTE — ED Notes (Signed)
Reports waking up during night with intense back pain and dysuria. No history of renal calculi.

## 2014-01-27 ENCOUNTER — Other Ambulatory Visit: Payer: Self-pay | Admitting: Obstetrics & Gynecology

## 2014-03-05 ENCOUNTER — Encounter: Payer: Self-pay | Admitting: Emergency Medicine

## 2014-06-11 ENCOUNTER — Ambulatory Visit (INDEPENDENT_AMBULATORY_CARE_PROVIDER_SITE_OTHER): Payer: BLUE CROSS/BLUE SHIELD | Admitting: Advanced Practice Midwife

## 2014-06-11 ENCOUNTER — Encounter: Payer: Self-pay | Admitting: Advanced Practice Midwife

## 2014-06-11 VITALS — BP 126/80 | HR 80 | Resp 16 | Ht 63.0 in | Wt 164.0 lb

## 2014-06-11 DIAGNOSIS — Z01419 Encounter for gynecological examination (general) (routine) without abnormal findings: Secondary | ICD-10-CM | POA: Diagnosis not present

## 2014-06-11 DIAGNOSIS — Z3041 Encounter for surveillance of contraceptive pills: Secondary | ICD-10-CM | POA: Diagnosis not present

## 2014-06-11 DIAGNOSIS — Z124 Encounter for screening for malignant neoplasm of cervix: Secondary | ICD-10-CM | POA: Diagnosis not present

## 2014-06-11 MED ORDER — LEVONORGEST-ETH ESTRAD 91-DAY 0.15-0.03 &0.01 MG PO TABS: 1.0000 | ORAL_TABLET | Freq: Every day | ORAL | Status: DC

## 2014-06-11 MED ORDER — BUTALBITAL-APAP-CAFFEINE 50-325-40 MG PO CAPS: 1.0000 | ORAL_CAPSULE | Freq: Four times a day (QID) | ORAL | Status: DC | PRN

## 2014-06-11 MED ORDER — RIZATRIPTAN BENZOATE 10 MG PO TABS: 10.0000 mg | ORAL_TABLET | ORAL | Status: DC | PRN

## 2014-06-11 NOTE — Patient Instructions (Signed)

## 2014-06-12 ENCOUNTER — Encounter: Payer: Self-pay | Admitting: Advanced Practice Midwife

## 2014-06-12 DIAGNOSIS — Z3041 Encounter for surveillance of contraceptive pills: Secondary | ICD-10-CM | POA: Insufficient documentation

## 2014-06-12 NOTE — Progress Notes (Signed)
  Subjective:     Tina Jennings is a 29 y.o. female here for a routine exam.  Current complaints: Wants to restart Seasonique OCPs.  Personal health questionnaire reviewed: not asked.   Gynecologic History Patient's last menstrual period was 06/05/2014. Contraception: oral progesterone-only contraceptive Last Pap: 1-2 yrs ago. Results were: normal Last mammogram: none.   Obstetric History OB History  Gravida Para Term Preterm AB SAB TAB Ectopic Multiple Living  1 1  1     1 3     # Outcome Date GA Lbr Len/2nd Weight Sex Delivery Anes PTL Lv  1A Preterm 12/23/12 6252w3d  2 lb 11.7 oz (1.24 kg) F CS-LTranv Spinal  Y     Comments: left ear tag on tragus   1B Preterm 12/23/12 8052w3d  3 lb 8.4 oz (1.6 kg) F CS-LTranv Spinal  Y  1C Preterm 12/23/12 7052w3d  3 lb 6.7 oz (1.55 kg) M CS-LTranv Spinal  Y       The following portions of the patient's history were reviewed and updated as appropriate: allergies, current medications, past family history, past medical history, past social history, past surgical history and problem list.  Review of Systems Pertinent items are noted in HPI.    Objective:    BP 126/80 mmHg  Pulse 80  Resp 16  Ht 5\' 3"  (1.6 m)  Wt 164 lb (74.39 kg)  BMI 29.06 kg/m2  LMP 06/05/2014  Breastfeeding? No General appearance: alert, cooperative and no distress Lungs: clear to auscultation bilaterally Breasts: normal appearance, no masses or tenderness, No nipple retraction or dimpling Heart: regular rate and rhythm, S1, S2 normal, no murmur, click, rub or gallop Abdomen: soft, non-tender; bowel sounds normal; no masses,  no organomegaly Pelvic: cervix normal in appearance, external genitalia normal, no adnexal masses or tenderness, no cervical motion tenderness, rectovaginal septum normal, uterus normal size, shape, and consistency and vagina normal without discharge    Assessment:    Healthy female exam.    Plan:    Contraception: OCP (estrogen/progesterone)  and Rx Seasonique.  If insurance will not pay for it, we can try Monophasic OCPs in continuous pattern. Follow up in: 1 year.

## 2014-06-13 LAB — CYTOLOGY - PAP

## 2015-03-06 ENCOUNTER — Encounter: Payer: Self-pay | Admitting: *Deleted

## 2015-09-02 ENCOUNTER — Other Ambulatory Visit: Payer: Self-pay | Admitting: Advanced Practice Midwife

## 2015-10-21 ENCOUNTER — Ambulatory Visit: Payer: BLUE CROSS/BLUE SHIELD | Admitting: Advanced Practice Midwife

## 2015-10-25 ENCOUNTER — Encounter: Payer: Self-pay | Admitting: Family

## 2015-10-25 ENCOUNTER — Ambulatory Visit (INDEPENDENT_AMBULATORY_CARE_PROVIDER_SITE_OTHER): Payer: BLUE CROSS/BLUE SHIELD | Admitting: Family

## 2015-10-25 VITALS — BP 117/76 | HR 99 | Resp 16 | Ht 63.0 in | Wt 134.0 lb

## 2015-10-25 DIAGNOSIS — Z3041 Encounter for surveillance of contraceptive pills: Secondary | ICD-10-CM

## 2015-10-25 DIAGNOSIS — Z124 Encounter for screening for malignant neoplasm of cervix: Secondary | ICD-10-CM

## 2015-10-25 DIAGNOSIS — Z01419 Encounter for gynecological examination (general) (routine) without abnormal findings: Secondary | ICD-10-CM

## 2015-10-25 DIAGNOSIS — Z1151 Encounter for screening for human papillomavirus (HPV): Secondary | ICD-10-CM

## 2015-10-25 MED ORDER — RIZATRIPTAN BENZOATE 10 MG PO TABS: 10.0000 mg | ORAL_TABLET | ORAL | Status: DC | PRN

## 2015-10-25 MED ORDER — LEVONORGEST-ETH ESTRAD 91-DAY 0.15-0.03 &0.01 MG PO TABS: 1.0000 | ORAL_TABLET | Freq: Every day | ORAL | Status: DC

## 2015-10-25 MED ORDER — BUTALBITAL-APAP-CAFFEINE 50-325-40 MG PO CAPS: 1.0000 | ORAL_CAPSULE | Freq: Four times a day (QID) | ORAL | Status: DC | PRN

## 2015-10-25 NOTE — Progress Notes (Deleted)
  Subjective:     Tina Jennings is a 30 y.o. female and is here for a comprehensive physical exam. The patient reports {problems:16946}.  Social History   Social History  . Marital Status: Married    Spouse Name: N/A  . Number of Children: N/A  . Years of Education: N/A   Occupational History  . Nurse [employer]   Social History Main Topics  . Smoking status: Never Smoker   . Smokeless tobacco: Never Used  . Alcohol Use: Yes  . Drug Use: No  . Sexual Activity:    Partners: Male    Birth Control/ Protection: Pill   Other Topics Concern  . Not on file   Social History Narrative   Health Maintenance  Topic Date Due  . INFLUENZA VACCINE  12/03/2015  . PAP SMEAR  06/11/2017  . TETANUS/TDAP  11/26/2022  . HIV Screening  Completed    {Common ambulatory SmartLinks:19316}  Review of Systems {ros; complete:30496}   Objective:    {Exam, Complete:[phone]}    Assessment:    Healthy female exam. ***     Plan:     See After Visit Summary for Counseling Recommendations

## 2015-10-25 NOTE — Progress Notes (Signed)
  Subjective:     Tina Jennings is a 30 y.o. female here for a routine exam.  Current complaints: reports rash on lower chin that started this morning, neg itching.  Also reports having more frequent headaches.  Personal health questionnaire reviewed: yes.  Going to [place] 30th [place], mother and mother-in-law will watch triplets.     Gynecologic History Patient's last menstrual period was 08/23/2015. Contraception: OCP (estrogen/progesterone) Last Pap: February 2017. Results were: normal Last mammogram: n/a.   Obstetric History OB History  Gravida Para Term Preterm AB SAB TAB Ectopic Multiple Living  1 1  1     1 3     # Outcome Date GA Lbr Len/2nd Weight Sex Delivery Anes PTL Lv  1A Preterm 12/23/12 479w3d  2 lb 11.7 oz (1.24 kg) F CS-LTranv Spinal  Y     Comments: left ear tag on tragus   1B Preterm 12/23/12 369w3d  3 lb 8.4 oz (1.6 kg) F CS-LTranv Spinal  Y  1C Preterm 12/23/12 339w3d  3 lb 6.7 oz (1.55 kg) M CS-LTranv Spinal  Y       The following portions of the patient's history were reviewed and updated as appropriate: allergies, current medications, past family history, past medical history, past social history, past surgical history and problem list.  Review of Systems Pertinent items are noted in HPI.    Objective:  BP 117/76 mmHg  Pulse 99  Resp 16  Ht 5\' 3"  (1.6 m)  Wt 134 lb (60.782 kg)  BMI 23.74 kg/m2  LMP 08/23/2015 General appearance: alert, cooperative and appears stated age Head: Normocephalic, without obvious abnormality, atraumatic Neck: no adenopathy, no carotid bruit, no JVD, supple, symmetrical, trachea midline and thyroid not enlarged, symmetric, no tenderness/mass/nodules Lungs: clear to auscultation bilaterally Breasts: normal appearance, no masses or tenderness, No nipple retraction or dimpling, No nipple discharge or bleeding, No axillary or supraclavicular adenopathy, Normal to palpation without dominant masses, Taught monthly breast self  examination Heart: regular rate and rhythm, S1, S2 normal, no murmur, click, rub or gallop Abdomen: soft, non-tender; bowel sounds normal; no masses,  no organomegaly Pelvic: cervix normal in appearance, external genitalia normal, no adnexal masses or tenderness, no cervical motion tenderness, rectovaginal septum normal, uterus normal size, shape, and consistency and vagina normal without discharge Skin: Skin color, texture, turgor normal. Rash on chin with erythematous base.      Assessment:    Healthy female exam.   Rash Headache   Plan:    Contraception: OCP (estrogen/progesterone).    RX Fioricet and Maxalt Apply hydrocortisone cream to face 1% (small amount) Pap smear collected with cotesting  Marlis EdelsonWalidah N Karim, CNM

## 2015-10-28 LAB — CYTOLOGY - PAP

## 2015-10-29 DIAGNOSIS — R300 Dysuria: Secondary | ICD-10-CM | POA: Diagnosis not present

## 2016-01-21 DIAGNOSIS — M7061 Trochanteric bursitis, right hip: Secondary | ICD-10-CM | POA: Diagnosis not present

## 2016-10-30 ENCOUNTER — Ambulatory Visit: Payer: BLUE CROSS/BLUE SHIELD | Admitting: Advanced Practice Midwife

## 2016-11-03 ENCOUNTER — Encounter: Payer: Self-pay | Admitting: Obstetrics & Gynecology

## 2016-11-03 ENCOUNTER — Ambulatory Visit (INDEPENDENT_AMBULATORY_CARE_PROVIDER_SITE_OTHER): Payer: BLUE CROSS/BLUE SHIELD | Admitting: Obstetrics & Gynecology

## 2016-11-03 VITALS — BP 137/88 | HR 102 | Resp 16 | Ht <= 58 in | Wt 141.0 lb

## 2016-11-03 DIAGNOSIS — Z01419 Encounter for gynecological examination (general) (routine) without abnormal findings: Secondary | ICD-10-CM

## 2016-11-03 MED ORDER — LEVONORGEST-ETH ESTRAD 91-DAY 0.15-0.03 &0.01 MG PO TABS
1.0000 | ORAL_TABLET | Freq: Every day | ORAL | 4 refills | Status: DC
Start: 2016-11-03 — End: 2017-02-24

## 2016-11-03 NOTE — Progress Notes (Signed)
Subjective:    Tina Jennings is a 31 y.o. MW P3 (triplets 31 yo) female who presents for an annual exam. The patient has no complaints today. The patient is sexually active. GYN screening history: last pap: was normal. The patient wears seatbelts: yes. The patient participates in regular exercise: yes. Has the patient ever been transfused or tattooed?: yes. The patient reports that there is not domestic violence in her life.   Menstrual History: OB History    Gravida Para Term Preterm AB Living   1 1   1   3    SAB TAB Ectopic Multiple Live Births         1 3      Menarche age: [age] No LMP recorded.    The following portions of the patient's history were reviewed and updated as appropriate: allergies, current medications, past family history, past medical history, past social history, past surgical history and problem list.  Review of Systems Pertinent items are noted in HPI.   Married for 31 years.  Happy with OCPs FH- + breast cancer- mGM, m great aunt, No gyn or colon cancer    Objective:    BP 137/88   Pulse (!) 102   Resp 16   Ht 4\' 5"  (1.346 m)   Wt 141 lb (64 kg)   LMP 10/27/2016   BMI 35.29 kg/m   General Appearance:    Alert, cooperative, no distress, appears stated age  Head:    Normocephalic, without obvious abnormality, atraumatic  Eyes:    PERRL, conjunctiva/corneas clear, EOM's intact, fundi    benign, both eyes  Ears:    Normal TM's and external ear canals, both ears  Nose:   Nares normal, septum midline, mucosa normal, no drainage    or sinus tenderness  Throat:   Lips, mucosa, and tongue normal; teeth and gums normal  Neck:   Supple, symmetrical, trachea midline, no adenopathy;    thyroid:  no enlargement/tenderness/nodules; no carotid   bruit or JVD  Back:     Symmetric, no curvature, ROM normal, no CVA tenderness  Lungs:     Clear to auscultation bilaterally, respirations unlabored  Chest Wall:    No tenderness or deformity   Heart:    Regular rate  and rhythm, S1 and S2 normal, no murmur, rub   or gallop  Breast Exam:    No tenderness, masses, or nipple abnormality  Abdomen:     Soft, non-tender, bowel sounds active all four quadrants,    no masses, no organomegaly  Genitalia:    Normal female without lesion, discharge or tenderness, NSSA, NT, mobile, no adnexal masses or tenderness     Extremities:   Extremities normal, atraumatic, no cyanosis or edema  Pulses:   2+ and symmetric all extremities  Skin:   Skin color, texture, turgor normal, no rashes or lesions  Lymph nodes:   Cervical, supraclavicular, and axillary nodes normal  Neurologic:   CNII-XII intact, normal strength, sensation and reflexes    throughout  .    Assessment:    Healthy female exam.    Plan:     Thin prep Pap smear. with cotesting Refill OCPs Fasting labs at her convenience

## 2016-11-03 NOTE — Addendum Note (Signed)
Addended by: Kathie DikeSOLA, Shahad Mazurek J on: 11/03/2016 04:27 PM   Modules accepted: Orders

## 2016-11-06 LAB — CYTOLOGY - PAP
Diagnosis: NEGATIVE
HPV (WINDOPATH): NOT DETECTED

## 2016-11-12 ENCOUNTER — Other Ambulatory Visit (INDEPENDENT_AMBULATORY_CARE_PROVIDER_SITE_OTHER): Payer: BLUE CROSS/BLUE SHIELD

## 2016-11-12 DIAGNOSIS — Z01419 Encounter for gynecological examination (general) (routine) without abnormal findings: Secondary | ICD-10-CM | POA: Diagnosis not present

## 2016-11-13 LAB — COMPREHENSIVE METABOLIC PANEL
ALK PHOS: 41 U/L (ref 33–115)
ALT: 18 U/L (ref 6–29)
AST: 23 U/L (ref 10–30)
Albumin: 4.1 g/dL (ref 3.6–5.1)
BILIRUBIN TOTAL: 1 mg/dL (ref 0.2–1.2)
BUN: 12 mg/dL (ref 7–25)
CO2: 23 mmol/L (ref 20–31)
CREATININE: 0.87 mg/dL (ref 0.50–1.10)
Calcium: 9.1 mg/dL (ref 8.6–10.2)
Chloride: 103 mmol/L (ref 98–110)
GLUCOSE: 77 mg/dL (ref 65–99)
Potassium: 4.6 mmol/L (ref 3.5–5.3)
Sodium: 138 mmol/L (ref 135–146)
Total Protein: 6.8 g/dL (ref 6.1–8.1)

## 2016-11-13 LAB — LIPID PANEL
CHOL/HDL RATIO: 3 ratio (ref <5.0)
Cholesterol: 187 mg/dL (ref <200)
HDL: 62 mg/dL (ref >50)
LDL Cholesterol: 104 mg/dL — ABNORMAL HIGH (ref <100)
Triglycerides: 105 mg/dL (ref <150)
VLDL: 21 mg/dL (ref <30)

## 2016-11-13 LAB — CBC
HCT: 41.6 % (ref 35.0–45.0)
Hemoglobin: 13.6 g/dL (ref 11.7–15.5)
MCH: 31.1 pg (ref 27.0–33.0)
MCHC: 32.7 g/dL (ref 32.0–36.0)
MCV: 95.2 fL (ref 80.0–100.0)
MPV: 10.4 fL (ref 7.5–12.5)
Platelets: 263 10*3/uL (ref 140–400)
RBC: 4.37 MIL/uL (ref 3.80–5.10)
RDW: 12.1 % (ref 11.0–15.0)
WBC: 6.5 10*3/uL (ref 3.8–10.8)

## 2016-11-13 LAB — TSH: TSH: 1.28 mIU/L

## 2016-11-17 ENCOUNTER — Telehealth: Payer: Self-pay | Admitting: *Deleted

## 2016-11-17 LAB — VITAMIN D 1,25 DIHYDROXY
VITAMIN D 1, 25 (OH) TOTAL: 65 pg/mL (ref 18–72)
VITAMIN D3 1, 25 (OH): 65 pg/mL
Vitamin D2 1, 25 (OH)2: <8 pg/mL

## 2016-11-17 NOTE — Telephone Encounter (Signed)
Pt notified of normal labs and a copy was mailed to her home address.

## 2016-11-26 ENCOUNTER — Other Ambulatory Visit: Payer: Self-pay | Admitting: *Deleted

## 2016-11-26 MED ORDER — BUTALBITAL-APAP-CAFFEINE 50-325-40 MG PO CAPS: 1.0000 | ORAL_CAPSULE | Freq: Four times a day (QID) | ORAL | 3 refills | Status: DC | PRN

## 2016-11-26 NOTE — Telephone Encounter (Signed)
Pt called stating that she left office after her last visit without getting her RX for Fioricet which Dr Marice Potterove was suppose to have sritten.  RX printed and patient will P/U

## 2017-01-05 DIAGNOSIS — X58XXXA Exposure to other specified factors, initial encounter: Secondary | ICD-10-CM | POA: Diagnosis not present

## 2017-01-05 DIAGNOSIS — S9031XA Contusion of right foot, initial encounter: Secondary | ICD-10-CM | POA: Diagnosis not present

## 2017-01-05 DIAGNOSIS — S99921A Unspecified injury of right foot, initial encounter: Secondary | ICD-10-CM | POA: Diagnosis not present

## 2017-02-22 ENCOUNTER — Other Ambulatory Visit: Payer: Self-pay | Admitting: Family

## 2017-02-24 ENCOUNTER — Other Ambulatory Visit: Payer: Self-pay | Admitting: *Deleted

## 2017-02-24 ENCOUNTER — Encounter: Payer: Self-pay | Admitting: Obstetrics & Gynecology

## 2017-02-24 DIAGNOSIS — Z3041 Encounter for surveillance of contraceptive pills: Secondary | ICD-10-CM

## 2017-02-24 MED ORDER — LEVONORGEST-ETH ESTRAD 91-DAY 0.15-0.03 &0.01 MG PO TABS: 1.0000 | ORAL_TABLET | Freq: Every day | ORAL | 4 refills | Status: DC

## 2017-02-24 NOTE — Telephone Encounter (Signed)
Pt called stating that her OCP went to the wrong pharmacy.  I resent it them to [facility]

## 2017-03-08 ENCOUNTER — Encounter: Payer: Self-pay | Admitting: Obstetrics & Gynecology

## 2017-08-22 DIAGNOSIS — J019 Acute sinusitis, unspecified: Secondary | ICD-10-CM | POA: Diagnosis not present

## 2017-10-25 DIAGNOSIS — M545 Low back pain: Secondary | ICD-10-CM | POA: Diagnosis not present

## 2017-10-25 DIAGNOSIS — M546 Pain in thoracic spine: Secondary | ICD-10-CM | POA: Diagnosis not present

## 2017-11-01 DIAGNOSIS — M545 Low back pain: Secondary | ICD-10-CM | POA: Diagnosis not present

## 2017-11-01 DIAGNOSIS — M5416 Radiculopathy, lumbar region: Secondary | ICD-10-CM | POA: Diagnosis not present

## 2017-11-01 DIAGNOSIS — M5412 Radiculopathy, cervical region: Secondary | ICD-10-CM | POA: Diagnosis not present

## 2017-11-01 DIAGNOSIS — M546 Pain in thoracic spine: Secondary | ICD-10-CM | POA: Diagnosis not present

## 2017-11-15 DIAGNOSIS — M545 Low back pain: Secondary | ICD-10-CM | POA: Diagnosis not present

## 2017-11-22 DIAGNOSIS — S29019A Strain of muscle and tendon of unspecified wall of thorax, initial encounter: Secondary | ICD-10-CM | POA: Diagnosis not present

## 2017-11-22 DIAGNOSIS — M545 Low back pain: Secondary | ICD-10-CM | POA: Diagnosis not present

## 2017-11-22 DIAGNOSIS — S29012A Strain of muscle and tendon of back wall of thorax, initial encounter: Secondary | ICD-10-CM | POA: Insufficient documentation

## 2017-11-22 DIAGNOSIS — S39012D Strain of muscle, fascia and tendon of lower back, subsequent encounter: Secondary | ICD-10-CM | POA: Diagnosis not present

## 2017-11-22 DIAGNOSIS — M5124 Other intervertebral disc displacement, thoracic region: Secondary | ICD-10-CM | POA: Diagnosis not present

## 2017-11-23 ENCOUNTER — Other Ambulatory Visit: Payer: Self-pay | Admitting: *Deleted

## 2017-11-23 ENCOUNTER — Encounter: Payer: Self-pay | Admitting: *Deleted

## 2017-11-23 MED ORDER — BUTALBITAL-APAP-CAFFEINE 50-325-40 MG PO CAPS: 1.0000 | ORAL_CAPSULE | Freq: Four times a day (QID) | ORAL | 3 refills | Status: DC | PRN

## 2017-11-30 ENCOUNTER — Encounter: Payer: Self-pay | Admitting: Obstetrics & Gynecology

## 2017-11-30 ENCOUNTER — Ambulatory Visit (INDEPENDENT_AMBULATORY_CARE_PROVIDER_SITE_OTHER): Payer: BLUE CROSS/BLUE SHIELD | Admitting: Obstetrics & Gynecology

## 2017-11-30 VITALS — BP 125/83 | HR 102 | Ht 63.0 in | Wt 146.0 lb

## 2017-11-30 DIAGNOSIS — Z01419 Encounter for gynecological examination (general) (routine) without abnormal findings: Secondary | ICD-10-CM

## 2017-11-30 DIAGNOSIS — R1905 Periumbilic swelling, mass or lump: Secondary | ICD-10-CM

## 2017-11-30 DIAGNOSIS — Z01411 Encounter for gynecological examination (general) (routine) with abnormal findings: Secondary | ICD-10-CM

## 2017-11-30 NOTE — Progress Notes (Signed)
Last pap 11/03/16- Normal

## 2017-12-01 ENCOUNTER — Other Ambulatory Visit: Payer: Self-pay | Admitting: Obstetrics & Gynecology

## 2017-12-01 DIAGNOSIS — R1905 Periumbilic swelling, mass or lump: Secondary | ICD-10-CM

## 2017-12-01 NOTE — Progress Notes (Signed)
Subjective:     Tina Jennings is a 32 y.o. female here for a routine exam.  Current complaints: bulge and pain on left side abdomen near umbilicus since march.  Not associated with constipation.  Not associated with cycles.  Can feel at night laying down and hurts to lie on left side b/c can feel bulge.  Pt has not taken anything for the pain or bulge.     Gynecologic History Patient's last menstrual period was 11/21/2017. Contraception: OCP (estrogen/progesterone) Last Pap: 2017. Results were: normal Last mammogram: n/a.  Obstetric History OB History  Gravida Para Term Preterm AB Living  1 1   1   3   SAB TAB Ectopic Multiple Live Births        1 3    # Outcome Date GA Lbr Len/2nd Weight Sex Delivery Anes PTL Lv  1A Preterm 12/23/12 2575w3d  2 lb 11.7 oz (1.24 kg) F CS-LTranv Spinal  LIV     Birth Comments: left ear tag on tragus   1B Preterm 12/23/12 375w3d  3 lb 8.4 oz (1.6 kg) F CS-LTranv Spinal  LIV  1C Preterm 12/23/12 1075w3d  3 lb 6.7 oz (1.55 kg) M CS-LTranv Spinal  LIV     The following portions of the patient's history were reviewed and updated as appropriate: allergies, current medications, past family history, past medical history, past social history, past surgical history and problem list.  Review of Systems Pertinent items noted in HPI and remainder of comprehensive ROS otherwise negative.    Objective:      Vitals:   11/30/17 1511  BP: 125/83  Pulse: (!) 102  Weight: 146 lb (66.2 kg)  Height: 5\' 3"  (1.6 m)   Vitals:  WNL General appearance: alert, cooperative and no distress  HEENT: Normocephalic, without obvious abnormality, atraumatic Eyes: negative Throat: lips, mucosa, and tongue normal; teeth and gums normal  Respiratory: Clear to auscultation bilaterally  CV: Regular rate and rhythm  Breasts:  Normal appearance, no masses or tenderness, no nipple retraction or dimpling  GI: Soft, non-tender; bowel sounds normal; 2 cm rectus diathesis, bulge in left  side of abdoinal wall.  Feels superficial.  GU: External Genitalia:  Tanner V, no lesion Urethra:  No prolapse   Vagina: Pink, normal rugae, no blood or discharge  Cervix: No CMT, no lesion  Uterus:  Normal size and contour, non tender  Adnexa: Normal, no masses, non tender  Musculoskeletal: No edema, redness or tenderness in the calves or thighs  Skin: No lesions or rash  Lymphatic: Axillary adenopathy: none     Psychiatric: Normal mood and behavior      Assessment:    Healthy female exam.    Plan:   Cont OCPs Pap due 2020 CT with IV and PO contrast of abd/pelvis to eval abdominal wall mass and pain.

## 2017-12-03 ENCOUNTER — Ambulatory Visit (INDEPENDENT_AMBULATORY_CARE_PROVIDER_SITE_OTHER): Payer: BLUE CROSS/BLUE SHIELD

## 2017-12-03 DIAGNOSIS — G8929 Other chronic pain: Secondary | ICD-10-CM

## 2017-12-03 DIAGNOSIS — R1012 Left upper quadrant pain: Secondary | ICD-10-CM

## 2017-12-03 MED ORDER — IOPAMIDOL (ISOVUE-300) INJECTION 61%
100.0000 mL | Freq: Once | INTRAVENOUS | Status: AC | PRN
  Administered 2017-12-03: 100 mL via INTRAVENOUS

## 2017-12-07 ENCOUNTER — Encounter (INDEPENDENT_AMBULATORY_CARE_PROVIDER_SITE_OTHER): Payer: Self-pay

## 2018-03-22 ENCOUNTER — Other Ambulatory Visit: Payer: Self-pay | Admitting: Obstetrics & Gynecology

## 2018-03-22 DIAGNOSIS — Z3041 Encounter for surveillance of contraceptive pills: Secondary | ICD-10-CM

## 2019-02-11 ENCOUNTER — Emergency Department
Admission: EM | Admit: 2019-02-11 | Discharge: 2019-02-11 | Disposition: A | Discharge status: Home / Self Care | Payer: BC Managed Care – PPO | Source: Home / Self Care | Attending: Emergency Medicine | Admitting: Emergency Medicine

## 2019-02-11 ENCOUNTER — Other Ambulatory Visit: Payer: Self-pay

## 2019-02-11 ENCOUNTER — Encounter: Payer: Self-pay | Admitting: *Deleted

## 2019-02-11 DIAGNOSIS — R05 Cough: Secondary | ICD-10-CM

## 2019-02-11 DIAGNOSIS — R062 Wheezing: Secondary | ICD-10-CM | POA: Diagnosis not present

## 2019-02-11 DIAGNOSIS — J209 Acute bronchitis, unspecified: Secondary | ICD-10-CM

## 2019-02-11 DIAGNOSIS — R059 Cough, unspecified: Secondary | ICD-10-CM

## 2019-02-11 MED ORDER — ALBUTEROL SULFATE HFA 108 (90 BASE) MCG/ACT IN AERS: 2.0000 | INHALATION_SPRAY | RESPIRATORY_TRACT | 0 refills | Status: AC | PRN

## 2019-02-11 MED ORDER — PREDNISONE 10 MG (21) PO TBPK: ORAL_TABLET | ORAL | 0 refills | Status: DC

## 2019-02-11 MED ORDER — IPRATROPIUM-ALBUTEROL 0.5-2.5 (3) MG/3ML IN SOLN
3.0000 mL | RESPIRATORY_TRACT | Status: AC
  Administered 2019-02-11: 14:00:00 3 mL via RESPIRATORY_TRACT

## 2019-02-11 MED ORDER — AZITHROMYCIN 250 MG PO TABS: ORAL_TABLET | ORAL | 0 refills | Status: DC

## 2019-02-11 NOTE — ED Notes (Signed)
Patient declined rapid covid test due to negative test 2 days ago. Send out test completed.

## 2019-02-11 NOTE — ED Provider Notes (Signed)
[facility]    CSN: [id number] Arrival date & time: 02/11/19  1306      History   Chief Complaint Chief Complaint  Patient presents with  . Cough    HPI Tina Jennings is a 33 y.o. female.   HPI Dry Cough x 2 weeks with chest tightness.  Feels wheezing. Using Advair and tessalon, helped a little.  OTC "cold and flu", helped a little..  Temps 99.1 range.  Denies fever over 100.  Has fatigue.  No chills or sweats.  Denies myalgias or arthralgias.  No headache or focal neurologic symptoms. Feels dyspneic at times.  Especially with exertion or talking.  No syncope.  No definite chest pain, but feels a vague chest discomfort when she is wheezing.  Denies exertional chest pain. COVID test 3 days ago negative with "health at work". Not exposed to anyone with COVID or COVID symptoms, to her knowledge. Denies current diagnosis of asthma, but history of asthma as a child, and has had episodes of wheezing with acute bronchitis as an adult.   + fatigue + Minimal nasal congestion No sore throat No swollen anterior neck glands  No  vomiting No  diarrhea No  acute vision changes No  stiff neck No  focal weakness No  syncope No  seizures No  GU symptoms No  new rash      Past Medical History:  Diagnosis Date  . Arthritis   . Herniated lumbar intervertebral disc   . Infertility   . IT band syndrome   . Low back pain   . Migraines     Patient Active Problem List   Diagnosis Date Noted  . Strain of thoracic back region 11/22/2017  . Oral contraceptive use 06/12/2014  . Pap smear of cervix not needed 02/12/2013  . Rh negative, maternal 02/08/2013  . Migraine 10/11/2012    Past Surgical History:  Procedure Laterality Date  . CESAREAN SECTION N/A 12/23/2012   Procedure: PRIMARY CESAREAN SECTION with delivery of Triplets;  Surgeon: Guss Bunde, MD;  Location: [facility];  Service: Obstetrics;  Laterality: N/A;    OB History    Gravida  1   Para  1   Term      Preterm  1   AB      Living  3     SAB      TAB      Ectopic      Multiple  1   Live Births  3            Home Medications    Prior to Admission medications   Medication Sig Start Date End Date Taking? Authorizing Provider  DAYSEE 0.15-0.03 &0.01 MG tablet TAKE 1 TABLET BY MOUTH  DAILY 04/04/18  Yes Dove, Myra C, MD  Multiple Vitamin (MULTIVITAMIN) capsule Take 1 capsule by mouth daily.   Yes [provider]  Omega-3 Fatty Acids (FISH OIL) 1000 MG CAPS Fish Oil   Yes [provider]  acetaminophen (TYLENOL) 325 MG tablet Take 650 mg by mouth every 6 (six) hours as needed for pain.    [provider]  albuterol (VENTOLIN HFA) 108 (90 Base) MCG/ACT inhaler Inhale 2 puffs into the lungs every 4 (four) hours as needed for wheezing or shortness of breath. 02/11/19   Jacqulyn Cane, MD  azithromycin (ZITHROMAX Z-PAK) 250 MG tablet Take 2 tablets on day one, then 1 tablet daily on days 2 through 5 02/11/19   Burnett Harry,  Onalee Huaavid, MD  Butalbital-APAP-Caffeine [id number] MG capsule Take 1-2 capsules by mouth every 6 (six) hours as needed for headache. Patient not taking: Reported on 11/30/2017 11/23/17   Allie Bossierove, Myra C, MD  ibuprofen (ADVIL,MOTRIN) 600 MG tablet Take 1 tablet (600 mg total) by mouth every 6 (six) hours as needed for pain. 12/25/12   Adam PhenixArnold, James G, MD  predniSONE (STERAPRED UNI-PAK 21 TAB) 10 MG (21) TBPK tablet Take as directed for 6 days.--Take 6 on day 1, 5 on day 2, 4 on day 3, then 3 tablets on day 4, then 2 tablets on day 5, then 1 on day 6. 02/11/19   Lajean ManesMassey, , MD    Family History Family History  Problem Relation Age of Onset  . Diabetes Father   . Hyperlipidemia Father   . Hypertension Father   . Cancer Maternal Grandmother   . Heart disease Maternal Grandmother   . Stroke Maternal Grandmother   . Heart disease Maternal Grandfather   . Stroke Maternal Grandfather   . Heart disease Paternal Grandfather   . Diabetes  Paternal Grandfather   . Stroke Paternal Grandfather     Social History Social History   Tobacco Use  . Smoking status: Never Smoker  . Smokeless tobacco: Never Used  Substance Use Topics  . Alcohol use: Yes  . Drug use: No     Allergies   Patient has no known allergies.   Review of Systems Review of Systems  All other systems reviewed and are negative.  Pertinent items noted in HPI and remainder of comprehensive ROS otherwise negative.  Physical Exam Triage Vital Signs ED Triage Vitals  Enc Vitals Group     BP 02/11/19 1320 (!) 143/86     Pulse Rate 02/11/19 1320 98     Resp 02/11/19 1320 20     Temp 02/11/19 1320 98.7 F (37.1 C)     Temp Source 02/11/19 1320 Oral     SpO2 02/11/19 1320 99 %     Weight 02/11/19 1321 154 lb (69.9 kg)     Height 02/11/19 1321 5\' 3"  (1.6 m)     Head Circumference --      Peak Flow --      Pain Score 02/11/19 1321 3     Pain Loc --      Pain Edu? --      Excl. in GC? --    No data found.  Updated Vital Signs BP (!) 143/86 (BP Location: Right Arm)   Pulse 98   Temp 98.7 F (37.1 C) (Oral)   Resp 20   Ht 5\' 3"  (1.6 m)   Wt 69.9 kg   LMP 12/12/2018 Comment: oral contraceptive  SpO2 99%   BMI 27.28 kg/m    Physical Exam Vitals signs and nursing note reviewed.  Constitutional:      General: She is not in acute distress.    Appearance: She is well-developed.     Comments: Pleasant, cooperative female.  Mildly dyspneic, especially when she is talking.  No acute cardiorespiratory distress. She appears ill, but not toxic.  HENT:     Head: Normocephalic and atraumatic.     Right Ear: Tympanic membrane normal.     Left Ear: Tympanic membrane normal.     Nose: Nose normal.     Mouth/Throat:     Pharynx: No oropharyngeal exudate.  Eyes:     General: No scleral icterus.       Right eye: No discharge.  Left eye: No discharge.  Neck:     Musculoskeletal: Neck supple.  Cardiovascular:     Rate and Rhythm: Normal  rate and regular rhythm.     Heart sounds: Normal heart sounds.  Pulmonary:     Effort: No respiratory distress.     Breath sounds: Wheezing and rhonchi present. No rales.     Comments: Coughing.-Hacking cough.  Appears dyspneic. Moderate expiratory wheezing bilaterally, worsen with forced expiration.  Scattered rhonchi, in all lung fields. Breath sounds equal.  No definite rales. Abdominal:     General: There is no distension.  Musculoskeletal: Normal range of motion.        General: No swelling or tenderness.     Right lower leg: No edema.     Left lower leg: No edema.  Lymphadenopathy:     Cervical: No cervical adenopathy.  Skin:    General: Skin is warm and dry.     Capillary Refill: Capillary refill takes less than 2 seconds.     Coloration: Skin is not jaundiced.     Findings: No rash.  Neurological:     General: No focal deficit present.     Mental Status: She is alert and oriented to person, place, and time.  Psychiatric:        Mood and Affect: Mood normal.     UC Treatments / Results  1:56 PM.  DuoNeb treatment ordered. I initially ordered rapid COVID antigen test, but patient declined that and preferred doing PCR COVID test, sent to reference lab.    Patient reevaluated after DuoNeb treatment.  Her breathing was improved.  Lungs were clear to auscultation and she stated she was feeling much better.   Labs (all labs ordered are listed, but only abnormal results are displayed) Labs Reviewed  NOVEL CORONAVIRUS, NAA      Radiology No results found.  Procedures Procedures (including critical care time)  Medications Ordered in UC Medications  ipratropium-albuterol (DUONEB) 0.5-2.5 (3) MG/3ML nebulizer solution 3 mL (3 mLs Nebulization Given 02/11/19 1412)    Initial Impression / Assessment and Plan / UC Course  I have reviewed the triage vital signs and the nursing notes.  Pertinent labs & imaging results that were available during my care of the  patient were reviewed by me and considered in my medical decision making (see chart for details).      Final Clinical Impressions(s) / UC Diagnoses   Final diagnoses:  Cough  Acute bronchitis with bronchospasm  Wheezing     Discharge Instructions     Your clinical diagnosis is acute bronchitis with wheezing/bronchospasm. Likely caused by either a bacterial or viral infection which flared up wheezing. We gave you bronchodilator nebulizer treatment here in urgent care, and your lungs sound better with no wheezes heard afterward. I know that you had a negative COVID test earlier this week, and that you only wanted one COVID test today in urgent care, so I decided to send off the most accurate COVID PCR test (just to clarify, we did not do the rapid COVID test here in urgent care). The treatment for bronchitis with wheezing is Prednisone Dosepak Zithromax Z-Pak, as antibiotic Albuterol hand-held nebulizer. Prescriptions for the above 3 sent to your pharmacy. Rest and drink plenty of fluids. To be on the safe side, I would not work until you get the COVID test back that were sending off today.   After risk benefits alternatives discussed, she agrees with treatment plan. Other precautions discussed.  Questions  invited and answered. Red flags discussed and what to do if any red flags. Follow-up with PCP if no better in 7 days, sooner if worse or new symptoms. She voiced understanding and agreement.  ED Prescriptions    Medication Sig Dispense Auth. Provider   azithromycin (ZITHROMAX Z-PAK) 250 MG tablet Take 2 tablets on day one, then 1 tablet daily on days 2 through 5 1 each Lajean Manes, MD   predniSONE (STERAPRED UNI-PAK 21 TAB) 10 MG (21) TBPK tablet Take as directed for 6 days.--Take 6 on day 1, 5 on day 2, 4 on day 3, then 3 tablets on day 4, then 2 tablets on day 5, then 1 on day 6. 21 tablet Lajean Manes, MD   albuterol (VENTOLIN HFA) 108 (90 Base) MCG/ACT inhaler Inhale 2  puffs into the lungs every 4 (four) hours as needed for wheezing or shortness of breath. 18 g Lajean Manes, MD     PDMP not reviewed this encounter.   Lajean Manes, MD 02/11/19 [phone]

## 2019-02-11 NOTE — Discharge Instructions (Signed)
Your clinical diagnosis is acute bronchitis with wheezing/bronchospasm. Likely caused by either a bacterial or viral infection which flared up wheezing. We gave you bronchodilator nebulizer treatment here in urgent care, and your lungs sound better with no wheezes heard afterward. I know that you had a negative COVID test earlier this week, and that you only wanted one COVID test today in urgent care, so I decided to send off the most accurate COVID PCR test (just to clarify, we did not do the rapid COVID test here in urgent care). The treatment for bronchitis with wheezing is Prednisone Dosepak Zithromax Z-Pak, as antibiotic Albuterol hand-held nebulizer. Prescriptions for the above 3 sent to your pharmacy. Rest and drink plenty of fluids. To be on the safe side, I would not work until you get the COVID test back that were sending off today.

## 2019-02-11 NOTE — ED Triage Notes (Signed)
Dry Cough x 2 weeks with chest tightness. Using Advair and tessalon. OTC severe cold and flu. Temps 99.1 COVID test 3 days ago negative with health at work. SOB with talking.

## 2019-02-12 LAB — NOVEL CORONAVIRUS, NAA: SARS-CoV-2, NAA: NOT DETECTED

## 2019-05-16 ENCOUNTER — Other Ambulatory Visit: Payer: Self-pay | Admitting: Obstetrics & Gynecology

## 2019-05-16 DIAGNOSIS — Z3041 Encounter for surveillance of contraceptive pills: Secondary | ICD-10-CM

## 2019-05-31 ENCOUNTER — Other Ambulatory Visit: Payer: Self-pay | Admitting: Advanced Practice Midwife

## 2019-07-28 ENCOUNTER — Encounter: Payer: Self-pay | Admitting: Certified Nurse Midwife

## 2019-07-28 ENCOUNTER — Other Ambulatory Visit: Payer: Self-pay

## 2019-07-28 ENCOUNTER — Ambulatory Visit (INDEPENDENT_AMBULATORY_CARE_PROVIDER_SITE_OTHER): Payer: BC Managed Care – PPO | Admitting: Certified Nurse Midwife

## 2019-07-28 VITALS — BP 124/79 | HR 93 | Temp 98.6°F | Resp 16 | Ht 63.0 in | Wt 148.0 lb

## 2019-07-28 DIAGNOSIS — Z98891 History of uterine scar from previous surgery: Secondary | ICD-10-CM | POA: Diagnosis not present

## 2019-07-28 DIAGNOSIS — Z124 Encounter for screening for malignant neoplasm of cervix: Secondary | ICD-10-CM

## 2019-07-28 DIAGNOSIS — Z793 Long term (current) use of hormonal contraceptives: Secondary | ICD-10-CM | POA: Diagnosis not present

## 2019-07-28 DIAGNOSIS — Z809 Family history of malignant neoplasm, unspecified: Secondary | ICD-10-CM

## 2019-07-28 DIAGNOSIS — Z01419 Encounter for gynecological examination (general) (routine) without abnormal findings: Secondary | ICD-10-CM

## 2019-07-28 DIAGNOSIS — Z1151 Encounter for screening for human papillomavirus (HPV): Secondary | ICD-10-CM | POA: Diagnosis not present

## 2019-07-28 DIAGNOSIS — Z3041 Encounter for surveillance of contraceptive pills: Secondary | ICD-10-CM

## 2019-07-28 MED ORDER — LEVONORGEST-ETH ESTRAD 91-DAY 0.15-0.03 &0.01 MG PO TABS: 1.0000 | ORAL_TABLET | Freq: Every day | ORAL | 3 refills | Status: DC

## 2019-07-28 NOTE — Progress Notes (Signed)
Gynecology Annual Exam   History of Present Illness: Tina Jennings is a 34 y.o. married female presenting for an annual exam. She has no complaints today. She is sexually active. She denies dyspareunia. She does not perform self breast exams. There is no notable family history of breast or ovarian cancer in her family.   Past Medical History:  Past Medical History:  Diagnosis Date  . Arthritis   . Herniated lumbar intervertebral disc   . Infertility   . IT band syndrome   . Low back pain   . Migraines     Past Surgical History:  Past Surgical History:  Procedure Laterality Date  . CESAREAN SECTION N/A 12/23/2012   Procedure: PRIMARY CESAREAN SECTION with delivery of Triplets;  Surgeon: Guss Bunde, MD;  Location: [facility];  Service: Obstetrics;  Laterality: N/A;    Gynecologic History:  LMP: Patient's last menstrual period was 05/30/2019. Average Interval: regular Heavy Menses: no Clots: no Intermenstrual Bleeding: no Postcoital Bleeding: no Dysmenorrhea: no Contraception: OCP (estrogen/progesterone)- taking continuous for 90 days Last Pap: completed on 11/03/16 ; result was: no abnormalities   Obstetric History: [id number]  Family History:  Family History  Problem Relation Age of Onset  . Diabetes Father   . Hyperlipidemia Father   . Hypertension Father   . Cancer Maternal Grandmother   . Heart disease Maternal Grandmother   . Stroke Maternal Grandmother   . Heart disease Maternal Grandfather   . Stroke Maternal Grandfather   . Heart disease Paternal Grandfather   . Diabetes Paternal Grandfather   . Stroke Paternal Grandfather     Social History:  Social History   Socioeconomic History  . Marital status: Married    Spouse name: Not on file  . Number of children: Not on file  . Years of education: Not on file  . Highest education level: Not on file  Occupational History  . Occupation: Optician, dispensing: [employer]  Tobacco Use  . Smoking status: Never  Smoker  . Smokeless tobacco: Never Used  Substance and Sexual Activity  . Alcohol use: Yes  . Drug use: No  . Sexual activity: Yes    Partners: Male    Birth control/protection: Pill  Other Topics Concern  . Not on file  Social History Narrative  . Not on file   Social Determinants of Health   Financial Resource Strain:   . Difficulty of Paying Living Expenses:   Food Insecurity:   . Worried About Charity fundraiser in the Last Year:   . Arboriculturist in the Last Year:   Transportation Needs:   . Film/video editor (Medical):   Marland Kitchen Lack of Transportation (Non-Medical):   Physical Activity:   . Days of Exercise per Week:   . Minutes of Exercise per Session:   Stress:   . Feeling of Stress :   Social Connections:   . Frequency of Communication with Friends and Family:   . Frequency of Social Gatherings with Friends and Family:   . Attends Religious Services:   . Active Member of Clubs or Organizations:   . Attends Archivist Meetings:   Marland Kitchen Marital Status:   Intimate Partner Violence:   . Fear of Current or Ex-Partner:   . Emotionally Abused:   Marland Kitchen Physically Abused:   . Sexually Abused:     Allergies:  No Known Allergies  Medications: Prior to Admission medications   Medication Sig Start Date End  Date Taking? Authorizing Provider  acetaminophen (TYLENOL) 325 MG tablet Take 650 mg by mouth every 6 (six) hours as needed for pain.   Yes [provider]  albuterol (VENTOLIN HFA) 108 (90 Base) MCG/ACT inhaler Inhale 2 puffs into the lungs every 4 (four) hours as needed for wheezing or shortness of breath. 02/11/19  Yes Lajean Manes, MD  butalbital-acetaminophen-caffeine (FIORICET) [phone] MG tablet Take by mouth 2 (two) times daily as needed for headache.   Yes [provider]  COLLAGEN PO Take by mouth.   Yes [provider]  ibuprofen (ADVIL,MOTRIN) 600 MG tablet Take 1 tablet (600 mg total) by mouth every 6 (six) hours as  needed for pain. 12/25/12  Yes Adam Phenix, MD  Levonorgestrel-Ethinyl Estradiol (AMETHIA) 0.15-0.03 &0.01 MG tablet Take 1 tablet by mouth daily. 07/28/19  Yes Donette Larry, CNM  Multiple Vitamin (MULTIVITAMIN) capsule Take 1 capsule by mouth daily.   Yes [provider]  Omega-3 Fatty Acids (FISH OIL) 1000 MG CAPS Fish Oil   Yes [provider]    Review of Systems: negative except noted in HPI  Physical Exam Vitals: BP 124/79   Pulse 93   Temp 98.6 F (37 C)   Resp 16   Ht 5\' 3"  (1.6 m)   Wt 148 lb (67.1 kg)   LMP 05/30/2019   BMI 26.22 kg/m  General: NAD HEENT: normocephalic, atraumatic Thyroid: no enlargement, no palpable nodules Pulmonary: Normal rate and effort, CTAB Cardiovascular: RRR Breast: Breast symmetrical, no tenderness, no palpable nodules or masses, no skin or nipple retraction present, no nipple discharge. No axillary or supraclavicular lymphadenopathy. Abdomen: soft, non-tender, non-distended. No hepatomegaly, splenomegaly or masses palpable. No evidence of hernia  Genitourinary:  External: Normal external female genitalia. Normal urethral meatus  Vagina: Normal vaginal mucosa, no evidence of prolapse   Cervix: Grossly normal in appearance, no bleeding  Uterus: Non-enlarged, mobile, normal contour. No CMT  Adnexa: non-enlarged, no masses  Rectal: deferred Extremities: no edema, erythema, or tenderness Neurologic: Grossly intact Psychiatric: mood appropriate, affect full  Female chaperone present for pelvic and breast portions of the physical exam  Assessment:  1. Well woman exam   2. Oral contraceptive pill surveillance   3. Encounter for surveillance of contraceptive pills     Plan: Pap today Rx Amethia Follow up with GYN in 1 year or prn Follow up with PCP as scheduled  06/01/2019, CNM 07/28/2019 12:31 PM

## 2019-07-31 LAB — CYTOLOGY - PAP
Comment: NEGATIVE
Diagnosis: NEGATIVE
High risk HPV: NEGATIVE

## 2019-08-07 ENCOUNTER — Telehealth: Payer: Self-pay

## 2019-08-07 DIAGNOSIS — Z3041 Encounter for surveillance of contraceptive pills: Secondary | ICD-10-CM

## 2019-08-07 MED ORDER — LEVONORGEST-ETH ESTRAD 91-DAY 0.15-0.03 &0.01 MG PO TABS: 1.0000 | ORAL_TABLET | Freq: Every day | ORAL | 3 refills | Status: DC

## 2019-08-07 NOTE — Telephone Encounter (Signed)
Pt called and requested Rx for OCP sent to a different pharmacy. Rx fixed and sent to correct pharmacy.

## 2020-02-08 ENCOUNTER — Other Ambulatory Visit: Payer: Self-pay

## 2020-02-08 ENCOUNTER — Emergency Department
Admission: RE | Admit: 2020-02-08 | Discharge: 2020-02-08 | Disposition: A | Discharge status: Home / Self Care | Payer: BC Managed Care – PPO | Source: Ambulatory Visit

## 2020-02-08 VITALS — BP 131/81 | HR 120 | Temp 98.9°F | Ht 63.0 in | Wt 150.0 lb

## 2020-02-08 DIAGNOSIS — H6692 Otitis media, unspecified, left ear: Secondary | ICD-10-CM

## 2020-02-08 DIAGNOSIS — J069 Acute upper respiratory infection, unspecified: Secondary | ICD-10-CM

## 2020-02-08 MED ORDER — AMOXICILLIN-POT CLAVULANATE 875-125 MG PO TABS: 1.0000 | ORAL_TABLET | Freq: Two times a day (BID) | ORAL | 0 refills | Status: DC

## 2020-02-08 MED ORDER — BENZONATATE 100 MG PO CAPS: 100.0000 mg | ORAL_CAPSULE | Freq: Three times a day (TID) | ORAL | 0 refills | Status: DC

## 2020-02-08 NOTE — Discharge Instructions (Signed)

## 2020-02-08 NOTE — ED Triage Notes (Addendum)
Cough, congestion,Lt ear pain, yellow mucus,x 6 days Vaccinated

## 2020-02-08 NOTE — ED Provider Notes (Signed)
[facility]    CSN: [id number] Arrival date & time: 02/08/20  1047      History   Chief Complaint Chief Complaint  Patient presents with  . Cough    HPI Tina Jennings is a 34 y.o. female.   HPI  Tina Jennings is a 34 y.o. female presenting to UC with c/o cough, congestion, and Left ear pain. Symptoms of mild congestion started about 1 week ago, ear pain started yesterday. Pain is aching and muffled sounding in her Left ear.  She has used OTC cough medication and her inhalers without relief.  She reports feeling jittery from taking the Robitussin with her inhalers and other cough medication so she stopped the Robitussin.  Denies fever, chills, n/v/d. Fully vaccinated for COVID. No known sick contacts.    Past Medical History:  Diagnosis Date  . Arthritis   . Herniated lumbar intervertebral disc   . Infertility   . IT band syndrome   . Low back pain   . Migraines     Patient Active Problem List   Diagnosis Date Noted  . Strain of thoracic back region 11/22/2017  . Oral contraceptive use 06/12/2014  . Pap smear of cervix not needed 02/12/2013  . Rh negative, maternal 02/08/2013  . Migraine 10/11/2012    Past Surgical History:  Procedure Laterality Date  . CESAREAN SECTION N/A 12/23/2012   Procedure: PRIMARY CESAREAN SECTION with delivery of Triplets;  Surgeon: Lesly Dukes, MD;  Location: [facility];  Service: Obstetrics;  Laterality: N/A;    OB History    Gravida  1   Para  1   Term      Preterm  1   AB      Living  3     SAB      TAB      Ectopic      Multiple  1   Live Births  3            Home Medications    Prior to Admission medications   Medication Sig Start Date End Date Taking? Authorizing Provider  Fluticasone-Salmeterol (ADVAIR) 100-50 MCG/DOSE AEPB Inhale 1 puff into the lungs 2 (two) times daily.   Yes [provider]  guaiFENesin-dextromethorphan (ROBITUSSIN DM) 100-10 MG/5ML syrup Take 5 mLs by  mouth every 4 (four) hours as needed for cough.   Yes [provider]  Pseudoephedrine-APAP-DM (DAYQUIL MULTI-SYMPTOM COLD/FLU PO) Take by mouth.   Yes [provider]  acetaminophen (TYLENOL) 325 MG tablet Take 650 mg by mouth every 6 (six) hours as needed for pain.    [provider]  albuterol (VENTOLIN HFA) 108 (90 Base) MCG/ACT inhaler Inhale 2 puffs into the lungs every 4 (four) hours as needed for wheezing or shortness of breath. 02/11/19   Lajean Manes, MD  amoxicillin-clavulanate (AUGMENTIN) 875-125 MG tablet Take 1 tablet by mouth 2 (two) times daily. One po bid x 7 days 02/08/20   Lurene Shadow, PA-C  benzonatate (TESSALON) 100 MG capsule Take 1-2 capsules (100-200 mg total) by mouth every 8 (eight) hours. 02/08/20   Lurene Shadow, PA-C  butalbital-acetaminophen-caffeine (FIORICET) [phone] MG tablet Take by mouth 2 (two) times daily as needed for headache.    [provider]  COLLAGEN PO Take by mouth.    [provider]  ibuprofen (ADVIL,MOTRIN) 600 MG tablet Take 1 tablet (600 mg total) by mouth every 6 (six) hours as needed for pain. 12/25/12  Adam Phenix, MD  Levonorgestrel-Ethinyl Estradiol (AMETHIA) 0.15-0.03 &0.01 MG tablet Take 1 tablet by mouth daily. 08/07/19   Donette Larry, CNM  Multiple Vitamin (MULTIVITAMIN) capsule Take 1 capsule by mouth daily.    [provider]  Omega-3 Fatty Acids (FISH OIL) 1000 MG CAPS Fish Oil    [provider]    Family History Family History  Problem Relation Age of Onset  . Diabetes Father   . Hyperlipidemia Father   . Hypertension Father   . Cancer Maternal Grandmother   . Heart disease Maternal Grandmother   . Stroke Maternal Grandmother   . Heart disease Maternal Grandfather   . Stroke Maternal Grandfather   . Heart disease Paternal Grandfather   . Diabetes Paternal Grandfather   . Stroke Paternal Grandfather     Social History Social History   Tobacco Use    . Smoking status: Never Smoker  . Smokeless tobacco: Never Used  Vaping Use  . Vaping Use: Never used  Substance Use Topics  . Alcohol use: Yes  . Drug use: No     Allergies   Patient has no known allergies.   Review of Systems Review of Systems  Constitutional: Negative for chills and fever.  HENT: Positive for congestion, ear pain, hearing loss (Left) and rhinorrhea. Negative for sore throat, trouble swallowing and voice change.   Respiratory: Positive for cough. Negative for shortness of breath.   Cardiovascular: Negative for chest pain and palpitations.  Gastrointestinal: Negative for abdominal pain, diarrhea, nausea and vomiting.  Musculoskeletal: Negative for arthralgias, back pain and myalgias.  Skin: Negative for rash.  Neurological: Negative for dizziness, light-headedness and headaches.  All other systems reviewed and are negative.    Physical Exam Triage Vital Signs ED Triage Vitals  Enc Vitals Group     BP 02/08/20 1108 131/81     Pulse Rate 02/08/20 1108 (!) 120     Resp --      Temp 02/08/20 1108 98.9 F (37.2 C)     Temp Source 02/08/20 1108 Oral     SpO2 02/08/20 1108 97 %     Weight 02/08/20 1109 150 lb (68 kg)     Height 02/08/20 1109 5\' 3"  (1.6 m)     Head Circumference --      Peak Flow --      Pain Score 02/08/20 1109 0     Pain Loc --      Pain Edu? --      Excl. in GC? --    No data found.  Updated Vital Signs BP 131/81 (BP Location: Right Arm)   Pulse (!) 120   Temp 98.9 F (37.2 C) (Oral)   Ht 5\' 3"  (1.6 m)   Wt 150 lb (68 kg)   SpO2 97%   BMI 26.57 kg/m   Visual Acuity Right Eye Distance:   Left Eye Distance:   Bilateral Distance:    Right Eye Near:   Left Eye Near:    Bilateral Near:     Physical Exam Vitals and nursing note reviewed.  Constitutional:      General: She is not in acute distress.    Appearance: Normal appearance. She is well-developed. She is not ill-appearing, toxic-appearing or diaphoretic.  HENT:      Head: Normocephalic and atraumatic.     Right Ear: Tympanic membrane and ear canal normal.     Left Ear: Tympanic membrane is erythematous. Tympanic membrane is not bulging.  Nose: Nose normal.     Right Sinus: No maxillary sinus tenderness or frontal sinus tenderness.     Left Sinus: No maxillary sinus tenderness or frontal sinus tenderness.     Mouth/Throat:     Lips: Pink.     Mouth: Mucous membranes are moist.     Pharynx: Oropharynx is clear. Uvula midline.  Cardiovascular:     Rate and Rhythm: Regular rhythm. Tachycardia present.  Pulmonary:     Effort: Pulmonary effort is normal. No respiratory distress.     Breath sounds: Normal breath sounds. No stridor. No wheezing, rhonchi or rales.     Comments: Dry hacking cough during exam but able to speak in full sentences. No respiratory distress.  Musculoskeletal:        General: Normal range of motion.     Cervical back: Normal range of motion and neck supple. No tenderness.  Lymphadenopathy:     Cervical: No cervical adenopathy.  Skin:    General: Skin is warm and dry.  Neurological:     Mental Status: She is alert and oriented to person, place, and time.  Psychiatric:        Behavior: Behavior normal.      UC Treatments / Results  Labs (all labs ordered are listed, but only abnormal results are displayed) Labs Reviewed - No data to display  EKG   Radiology No results found.  Procedures Procedures (including critical care time)  Medications Ordered in UC Medications - No data to display  Initial Impression / Assessment and Plan / UC Course  I have reviewed the triage vital signs and the nursing notes.  Pertinent labs & imaging results that were available during my care of the patient were reviewed by me and considered in my medical decision making (see chart for details).     Pt tachycardic, denies chest pain or SOB at this time Pt has been taking severe OTC cough medications and using her albuterol  PTA. Low concern for PE at this time Left ear: c/w AOM Rx: Augmentin F/u with PCP next week for recheck  AVS given  Final Clinical Impressions(s) / UC Diagnoses   Final diagnoses:  Upper respiratory tract infection, unspecified type  Left acute otitis media     Discharge Instructions     Please take antibiotics as prescribed and be sure to complete entire course even if you start to feel better to ensure infection does not come back.  You may take 500mg  acetaminophen every 4-6 hours or in combination with ibuprofen 400-600mg  every 6-8 hours as needed for pain, inflammation, and fever.  Be sure to well hydrated with clear liquids and get at least 8 hours of sleep at night, preferably more while sick.   Please follow up with family medicine in 1 week if needed.     ED Prescriptions    Medication Sig Dispense Auth. Provider   amoxicillin-clavulanate (AUGMENTIN) 875-125 MG tablet Take 1 tablet by mouth 2 (two) times daily. One po bid x 7 days 14 tablet Eleora Sutherland O, PA-C   benzonatate (TESSALON) 100 MG capsule Take 1-2 capsules (100-200 mg total) by mouth every 8 (eight) hours. 21 capsule 12-14-1986, Lurene Shadow     PDMP not reviewed this encounter.   [place], PA-C 02/08/20 1300

## 2020-02-13 ENCOUNTER — Other Ambulatory Visit: Payer: Self-pay

## 2020-02-13 ENCOUNTER — Emergency Department (INDEPENDENT_AMBULATORY_CARE_PROVIDER_SITE_OTHER): Payer: BC Managed Care – PPO

## 2020-02-13 ENCOUNTER — Emergency Department
Admission: RE | Admit: 2020-02-13 | Discharge: 2020-02-13 | Disposition: A | Discharge status: Home / Self Care | Payer: BC Managed Care – PPO | Source: Ambulatory Visit | Attending: Family Medicine | Admitting: Family Medicine

## 2020-02-13 VITALS — BP 143/86 | HR 121 | Temp 98.6°F | Resp 18 | Ht 63.0 in | Wt 150.0 lb

## 2020-02-13 DIAGNOSIS — R059 Cough, unspecified: Secondary | ICD-10-CM | POA: Diagnosis not present

## 2020-02-13 DIAGNOSIS — J189 Pneumonia, unspecified organism: Secondary | ICD-10-CM

## 2020-02-13 DIAGNOSIS — R053 Chronic cough: Secondary | ICD-10-CM

## 2020-02-13 DIAGNOSIS — J90 Pleural effusion, not elsewhere classified: Secondary | ICD-10-CM | POA: Diagnosis not present

## 2020-02-13 LAB — POCT CBC W AUTO DIFF (K'VILLE URGENT CARE)

## 2020-02-13 MED ORDER — FLUTICASONE-SALMETEROL 100-50 MCG/DOSE IN AEPB: 1.0000 | INHALATION_SPRAY | Freq: Two times a day (BID) | RESPIRATORY_TRACT | 1 refills | Status: AC

## 2020-02-13 MED ORDER — PREDNISONE 20 MG PO TABS: ORAL_TABLET | ORAL | 0 refills | Status: DC

## 2020-02-13 MED ORDER — GUAIFENESIN-CODEINE 100-10 MG/5ML PO SOLN: ORAL | 0 refills | Status: DC

## 2020-02-13 MED ORDER — METHYLPREDNISOLONE SODIUM SUCC 125 MG IJ SOLR
80.0000 mg | Freq: Once | INTRAMUSCULAR | Status: AC
  Administered 2020-02-13: 80 mg via INTRAMUSCULAR

## 2020-02-13 MED ORDER — AZITHROMYCIN 250 MG PO TABS: ORAL_TABLET | ORAL | 0 refills | Status: DC

## 2020-02-13 NOTE — Discharge Instructions (Addendum)
Begin prednisone Wednesday 02/14/20. Take plain guaifenesin (1200mg  extended release tabs such as Mucinex) twice daily, with plenty of water, for cough and congestion.   Get adequate rest.   Continue albuterol inhaler as needed. Try warm salt water gargles for sore throat.  Stop all antihistamines (Zyrtec, etc) for now, and other non-prescription cough/cold preparations.   If symptoms become significantly worse during the night or over the weekend, proceed to the local emergency room.

## 2020-02-13 NOTE — ED Triage Notes (Signed)
Productive Cough, sharp pain in left chest when coughing

## 2020-02-13 NOTE — ED Provider Notes (Signed)
[facility]    CSN: [id number] Arrival date & time: 02/13/20  0956      History   Chief Complaint Chief Complaint  Patient presents with  . Appointment  . Cough    HPI Tina Jennings is a 34 y.o. female.   Patient complains of a persistent cough for about 10 to 12 days, and last night developed sharp pleuritic pain in her left chest. Review of records reveals that she was treated here five days ago for otitis media with Augmentin.  Her cough is non-productive, and she now often coughs until she gags.  She denies fever. She had asthma as a child, and has a history of perennial rhinitis for which she takes Zyrtec. Her last Tdap was 11/25/12.  The history is provided by the patient.    Past Medical History:  Diagnosis Date  . Arthritis   . Herniated lumbar intervertebral disc   . Infertility   . IT band syndrome   . Low back pain   . Migraines     Patient Active Problem List   Diagnosis Date Noted  . Strain of thoracic back region 11/22/2017  . Oral contraceptive use 06/12/2014  . Pap smear of cervix not needed 02/12/2013  . Rh negative, maternal 02/08/2013  . Migraine 10/11/2012    Past Surgical History:  Procedure Laterality Date  . CESAREAN SECTION N/A 12/23/2012   Procedure: PRIMARY CESAREAN SECTION with delivery of Triplets;  Surgeon: Lesly DukesKelly H Leggett, MD;  Location: [facility];  Service: Obstetrics;  Laterality: N/A;    OB History    Gravida  1   Para  1   Term      Preterm  1   AB      Living  3     SAB      TAB      Ectopic      Multiple  1   Live Births  3            Home Medications    Prior to Admission medications   Medication Sig Start Date End Date Taking? Authorizing Provider  acetaminophen (TYLENOL) 325 MG tablet Take 650 mg by mouth every 6 (six) hours as needed for pain.    [provider]  albuterol (VENTOLIN HFA) 108 (90 Base) MCG/ACT inhaler Inhale 2 puffs into the lungs every 4 (four) hours as  needed for wheezing or shortness of breath. 02/11/19   Lajean ManesMassey, David, MD  amoxicillin-clavulanate (AUGMENTIN) 875-125 MG tablet Take 1 tablet by mouth 2 (two) times daily. One po bid x 7 days 02/08/20   Lurene ShadowPhelps, Erin O, PA-C  azithromycin (ZITHROMAX Z-PAK) 250 MG tablet Take 2 tabs today; then begin one tab once daily for 4 more days. 02/13/20   Lattie HawBeese, Walther Sanagustin A, MD  benzonatate (TESSALON) 100 MG capsule Take 1-2 capsules (100-200 mg total) by mouth every 8 (eight) hours. 02/08/20   Lurene ShadowPhelps, Erin O, PA-C  butalbital-acetaminophen-caffeine (FIORICET) [phone] MG tablet Take by mouth 2 (two) times daily as needed for headache.    [provider]  COLLAGEN PO Take by mouth.    [provider]  Fluticasone-Salmeterol (ADVAIR) 100-50 MCG/DOSE AEPB Inhale 1 puff into the lungs 2 (two) times daily. 02/13/20   Lattie HawBeese, Abra Lingenfelter A, MD  guaiFENesin-codeine 100-10 MG/5ML syrup Take 10mL by mouth at bedtime as needed for cough. 02/13/20   Lattie HawBeese, Kynzli Rease A, MD  guaiFENesin-dextromethorphan (ROBITUSSIN DM) 100-10 MG/5ML syrup Take 5 mLs by mouth every 4 (  four) hours as needed for cough.    [provider]  ibuprofen (ADVIL,MOTRIN) 600 MG tablet Take 1 tablet (600 mg total) by mouth every 6 (six) hours as needed for pain. 12/25/12   Adam Phenix, MD  Levonorgestrel-Ethinyl Estradiol (AMETHIA) 0.15-0.03 &0.01 MG tablet Take 1 tablet by mouth daily. 08/07/19   Donette Larry, CNM  Multiple Vitamin (MULTIVITAMIN) capsule Take 1 capsule by mouth daily.    [provider]  Omega-3 Fatty Acids (FISH OIL) 1000 MG CAPS Fish Oil    [provider]  predniSONE (DELTASONE) 20 MG tablet Take one tab by mouth twice daily for 4 days, then one daily for 3 days. Take with food. 02/13/20   Lattie Haw, MD  Pseudoephedrine-APAP-DM (DAYQUIL MULTI-SYMPTOM COLD/FLU PO) Take by mouth.    [provider]    Family History Family History  Problem Relation Age of Onset  . Diabetes  Father   . Hyperlipidemia Father   . Hypertension Father   . Cancer Maternal Grandmother   . Heart disease Maternal Grandmother   . Stroke Maternal Grandmother   . Heart disease Maternal Grandfather   . Stroke Maternal Grandfather   . Heart disease Paternal Grandfather   . Diabetes Paternal Grandfather   . Stroke Paternal Grandfather     Social History Social History   Tobacco Use  . Smoking status: Never Smoker  . Smokeless tobacco: Never Used  Vaping Use  . Vaping Use: Never used  Substance Use Topics  . Alcohol use: Yes  . Drug use: No     Allergies   Patient has no known allergies.   Review of Systems Review of Systems No sore throat + cough + pleuritic pain No wheezing + nasal congestion No post-nasal drainage No sinus pain/pressure No itchy/red eyes + earache, resolved No hemoptysis No SOB No fever/chills No nausea No vomiting No abdominal pain No diarrhea No urinary symptoms No skin rash + fatigue No myalgias No headache Used OTC meds (Daquil, Zyrtec) without relief   Physical Exam Triage Vital Signs ED Triage Vitals  Enc Vitals Group     BP 02/13/20 1037 (!) 143/86     Pulse Rate 02/13/20 1037 (!) 121     Resp 02/13/20 1037 18     Temp 02/13/20 1037 98.6 F (37 C)     Temp Source 02/13/20 1037 Oral     SpO2 02/13/20 1037 97 %     Weight 02/13/20 1038 150 lb (68 kg)     Height 02/13/20 1038 5\' 3"  (1.6 m)     Head Circumference --      Peak Flow --      Pain Score 02/13/20 1038 7     Pain Loc --      Pain Edu? --      Excl. in GC? --    No data found.  Updated Vital Signs BP (!) 143/86 (BP Location: Right Arm)   Pulse (!) 121   Temp 98.6 F (37 C) (Oral)   Resp 18   Ht 5\' 3"  (1.6 m)   Wt 68 kg   SpO2 97%   BMI 26.57 kg/m   Visual Acuity Right Eye Distance:   Left Eye Distance:   Bilateral Distance:    Right Eye Near:   Left Eye Near:    Bilateral Near:     Physical Exam Chest:       Comments: Tenderness to  palpation left anterior/inferior ribs.  Nursing notes and  Vital Signs reviewed. Appearance:  Patient appears stated age, and in no acute distress Eyes:  Pupils are equal, round, and reactive to light and accomodation.  Extraocular movement is intact.  Conjunctivae are not inflamed  Ears:  Canals normal.  Right tympanic membrane normal.  Left tympanic membrane slightly bulging but otherwise appears normal. Nose:  Mildly congested turbinates.  No sinus tenderness.  Pharynx:  Normal; moist mucous membranes  Neck:  Supple.  Mildly enlarged lateral nodes are present, tender to palpation on the left.   Lungs:  Clear to auscultation.  Breath sounds are equal.  Moving air well.  Tenderness to palpation left anterior chest (see diagram). Heart:  Regular rate and rhythm without murmurs, rubs, or gallops.  Abdomen:  Nontender without masses or hepatosplenomegaly.  Bowel sounds are present.  No CVA or flank tenderness.  Extremities:  No edema.  No tenderness to palpation. Skin:  No rash present.    UC Treatments / Results  Labs (all labs ordered are listed, but only abnormal results are displayed) Labs Reviewed  POCT CBC W AUTO DIFF ([facility]):  WBC 10.9; LY 20.5; MO 3.7; GR 75.8; Hgb 13.2; Platelets 387     EKG   Radiology DG Chest 2 View  Result Date: 02/13/2020 CLINICAL DATA:  Persistent cough for 12 days. LEFT anterior pleuritic pain EXAM: CHEST - 2 VIEW COMPARISON:  None FINDINGS: Trachea midline. Cardiomediastinal contours and hilar structures are normal. Subtle opacity in the LEFT mid chest, no lobar consolidation or pleural effusion. The area in the LEFT mid chest shows ill-defined interstitial pattern best seen on frontal view. No acute skeletal process. IMPRESSION: 1. Subtle opacity in the LEFT mid chest, potentially developing pneumonitis. No lobar consolidation or pleural effusion. 2. Follow-up is suggested to ensure resolution. Findings could be sequela of atypical  infection. Electronically Signed   By: Donzetta Kohut M.D.   On: 02/13/2020 11:38    Procedures Procedures (including critical care time)  Medications Ordered in UC Medications  methylPREDNISolone sodium succinate (SOLU-MEDROL) 125 mg/2 mL injection 80 mg (has no administration in time range)    Initial Impression / Assessment and Plan / UC Course  I have reviewed the triage vital signs and the nursing notes.  Pertinent labs & imaging results that were available during my care of the patient were reviewed by me and considered in my medical decision making (see chart for details).    Administered Solumedrol 80mg  IM; then begin prednisone burst/taper. Note subtle opacity in the LEFT mid chest.  Begin Z-pak for atypical coverage.   Rx for Robitussin AC for night time cough.  Refill Advair. Followup with PCP in 10 days for repeat chest x-ray.   Final Clinical Impressions(s) / UC Diagnoses   Final diagnoses:  Pneumonitis  Persistent cough     Discharge Instructions     Begin prednisone Wednesday 02/14/20. Take plain guaifenesin (1200mg  extended release tabs such as Mucinex) twice daily, with plenty of water, for cough and congestion.   Get adequate rest.   Continue albuterol inhaler as needed. Try warm salt water gargles for sore throat.  Stop all antihistamines (Zyrtec, etc) for now, and other non-prescription cough/cold preparations.   If symptoms become significantly worse during the night or over the weekend, proceed to the local emergency room.     ED Prescriptions    Medication Sig Dispense Auth. Provider   predniSONE (DELTASONE) 20 MG tablet Take one tab by mouth twice daily for 4 days, then one  daily for 3 days. Take with food. 11 tablet Lattie Haw, MD   azithromycin (ZITHROMAX Z-PAK) 250 MG tablet Take 2 tabs today; then begin one tab once daily for 4 more days. 6 tablet Lattie Haw, MD   guaiFENesin-codeine 100-10 MG/5ML syrup Take 74mL by mouth at  bedtime as needed for cough. 50 mL Lattie Haw, MD   Fluticasone-Salmeterol (ADVAIR) 100-50 MCG/DOSE AEPB Inhale 1 puff into the lungs 2 (two) times daily. 60 each Lattie Haw, MD        Lattie Haw, MD 02/16/20 [phone]

## 2020-02-23 ENCOUNTER — Encounter: Payer: Self-pay | Admitting: Medical-Surgical

## 2020-02-23 ENCOUNTER — Ambulatory Visit (INDEPENDENT_AMBULATORY_CARE_PROVIDER_SITE_OTHER): Payer: BC Managed Care – PPO

## 2020-02-23 ENCOUNTER — Telehealth (INDEPENDENT_AMBULATORY_CARE_PROVIDER_SITE_OTHER): Payer: BC Managed Care – PPO | Admitting: Medical-Surgical

## 2020-02-23 ENCOUNTER — Other Ambulatory Visit: Payer: Self-pay

## 2020-02-23 VITALS — HR 110 | Temp 98.9°F

## 2020-02-23 DIAGNOSIS — R0600 Dyspnea, unspecified: Secondary | ICD-10-CM

## 2020-02-23 DIAGNOSIS — Z7689 Persons encountering health services in other specified circumstances: Secondary | ICD-10-CM

## 2020-02-23 DIAGNOSIS — R0602 Shortness of breath: Secondary | ICD-10-CM | POA: Diagnosis not present

## 2020-02-23 DIAGNOSIS — R000 Tachycardia, unspecified: Secondary | ICD-10-CM | POA: Diagnosis not present

## 2020-02-23 DIAGNOSIS — R918 Other nonspecific abnormal finding of lung field: Secondary | ICD-10-CM | POA: Diagnosis not present

## 2020-02-23 DIAGNOSIS — J189 Pneumonia, unspecified organism: Secondary | ICD-10-CM | POA: Diagnosis not present

## 2020-02-23 DIAGNOSIS — R0989 Other specified symptoms and signs involving the circulatory and respiratory systems: Secondary | ICD-10-CM | POA: Diagnosis not present

## 2020-02-23 LAB — COMPLETE METABOLIC PANEL WITH GFR
AG Ratio: 1.5 (calc) (ref 1.0–2.5)
ALT: 29 U/L (ref 6–29)
AST: 18 U/L (ref 10–30)
Albumin: 4 g/dL (ref 3.6–5.1)
Alkaline phosphatase (APISO): 50 U/L (ref 31–125)
BUN: 12 mg/dL (ref 7–25)
CO2: 27 mmol/L (ref 20–32)
Calcium: 9.2 mg/dL (ref 8.6–10.2)
Chloride: 103 mmol/L (ref 98–110)
Creat: 0.78 mg/dL (ref 0.50–1.10)
GFR, Est African American: 116 mL/min/{1.73_m2} (ref >=60)
GFR, Est Non African American: 100 mL/min/{1.73_m2} (ref >=60)
Globulin: 2.6 g/dL (calc) (ref 1.9–3.7)
Glucose, Bld: 90 mg/dL (ref 65–99)
Potassium: 4.9 mmol/L (ref 3.5–5.3)
Sodium: 138 mmol/L (ref 135–146)
Total Bilirubin: 0.8 mg/dL (ref 0.2–1.2)
Total Protein: 6.6 g/dL (ref 6.1–8.1)

## 2020-02-23 LAB — CBC WITH DIFFERENTIAL/PLATELET
Absolute Monocytes: 702 cells/uL (ref 200–950)
Basophils Absolute: 73 cells/uL (ref 0–200)
Basophils Relative: 0.6 %
Eosinophils Absolute: 109 cells/uL (ref 15–500)
Eosinophils Relative: 0.9 %
HCT: 44.3 % (ref 35.0–45.0)
Hemoglobin: 14.5 g/dL (ref 11.7–15.5)
Lymphs Abs: 3582 cells/uL (ref 850–3900)
MCH: 30.8 pg (ref 27.0–33.0)
MCHC: 32.7 g/dL (ref 32.0–36.0)
MCV: 94.1 fL (ref 80.0–100.0)
MPV: 10.9 fL (ref 7.5–12.5)
Monocytes Relative: 5.8 %
Neutro Abs: 7635 cells/uL (ref 1500–7800)
Neutrophils Relative %: 63.1 %
Platelets: 366 10*3/uL (ref 140–400)
RBC: 4.71 10*6/uL (ref 3.80–5.10)
RDW: 11.3 % (ref 11.0–15.0)
Total Lymphocyte: 29.6 %
WBC: 12.1 10*3/uL — ABNORMAL HIGH (ref 3.8–10.8)

## 2020-02-23 LAB — D-DIMER, QUANTITATIVE: D-Dimer, Quant: 0.38 mcg/mL FEU (ref <0.50)

## 2020-02-23 NOTE — Progress Notes (Signed)
Virtual Visit via Video Note  I connected with Tina Jennings on 02/23/20 at  9:40 AM EDT by a video enabled telemedicine application and verified that I am speaking with the correct person using two identifiers.   I discussed the limitations of evaluation and management by telemedicine and the availability of in person appointments. The patient expressed understanding and agreed to proceed.  Patient location: home Provider locations: office  Subjective:    CC: Establish care, urgent care follow-up  HPI: Pleasant 34 year old female presenting today via MyChart video visit to establish care and for 2-year follow-up. She reports she received a Covid booster shot on 02/02/2020 and developed respiratory symptoms on 02/03/2020. She was seen in urgent care on 10/7 and diagnosed with an upper respiratory infection and a left otitis media. At that time she was provided with a 7-day course of Augmentin. She took this medication as prescribed but did not see any resolution to her symptoms. She returned to urgent care on 10/12 for further evaluation. At that time she had a CBC which was within normal limits and an x-ray that showed a possible left midlung pneumonitis. She was provided with a steroid injection followed by a course of oral steroids at home, a azithromycin, and cough medications. She completed her medications as instructed and set up an appointment to establish with a PCP as she has been getting her preventative care with her OB/GYN. Today she presents with continued shortness of breath, especially with any activity. Her cough has improved some but she continues to have chest tightness. Her heart rate is elevated at rest 100-110s, with exertion up to 130s. Once elevated that high, she reports it takes quite a while for it to come down. She continues to have pleuritic pain just under her left breast. Denies fever, chills, radiating chest pain, GI symptoms, and sinus congestion. 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 Notes that she was not tested for Covid at either of her urgent care visits and did not do any home tests.   Past medical history, Surgical history, Family history not pertinant except as noted below, Social history, Allergies, and medications have been entered into the medical record, reviewed, and corrections made.   Review of Systems: See HPI for pertinent positives and negatives.   Objective:    General: Speaking clearly in complete sentences without any shortness of breath.  Alert and oriented x3.  Normal judgment. No apparent acute distress.    Impression and Recommendations:    1. Tachycardia/shortness of breath/respiratory symptoms Rechecking CBC with differential. We will also check CMP. Getting a repeat chest x-ray. But tachycardia and shortness of breath, adding a D-dimer as there is some suspicion of a possible PE. Since she did not Covid tested, we will go ahead and complete a drive a Covid test today. She may not test positive after 3 weeks of symptoms but if she does, we may have an answer to her continued symptoms. - CBC with Differential/Platelet - COMPLETE METABOLIC PANEL WITH GFR - D-dimer, quantitative (not at [facility]) - DG Chest 2 View; Future - Novel Coronavirus, NAA ([facility])  2. Encounter to establish care Reviewed available information and discussed current concerns with patient.  35 minutes of non face-to-face time was provided during this encounter.  Return if symptoms worsen or fail to improve.  I discussed the assessment and treatment plan with the patient. The patient was provided an opportunity to ask questions  and all were answered. The patient agreed with the plan and demonstrated an understanding of the instructions.   The patient was advised to call back or seek an in-person evaluation if the symptoms worsen or if the condition fails to improve as anticipated.  Thayer Ohm, DNP,  APRN, FNP-BC [facility]

## 2020-02-25 LAB — SARS-COV-2, NAA 2 DAY TAT

## 2020-02-25 LAB — NOVEL CORONAVIRUS, NAA: SARS-CoV-2, NAA: NOT DETECTED

## 2020-05-23 DIAGNOSIS — Z20822 Contact with and (suspected) exposure to covid-19: Secondary | ICD-10-CM | POA: Diagnosis not present

## 2020-09-09 ENCOUNTER — Other Ambulatory Visit: Payer: Self-pay | Admitting: *Deleted

## 2020-09-09 DIAGNOSIS — Z3041 Encounter for surveillance of contraceptive pills: Secondary | ICD-10-CM

## 2020-09-09 MED ORDER — LEVONORGEST-ETH ESTRAD 91-DAY 0.15-0.03 &0.01 MG PO TABS: 1.0000 | ORAL_TABLET | Freq: Every day | ORAL | 0 refills | Status: DC

## 2020-09-17 ENCOUNTER — Encounter: Payer: Self-pay | Admitting: Advanced Practice Midwife

## 2020-09-17 ENCOUNTER — Ambulatory Visit (INDEPENDENT_AMBULATORY_CARE_PROVIDER_SITE_OTHER): Payer: BC Managed Care – PPO | Admitting: Advanced Practice Midwife

## 2020-09-17 ENCOUNTER — Other Ambulatory Visit: Payer: Self-pay | Admitting: *Deleted

## 2020-09-17 ENCOUNTER — Other Ambulatory Visit: Payer: Self-pay

## 2020-09-17 VITALS — Resp 16 | Ht 63.0 in | Wt 159.0 lb

## 2020-09-17 DIAGNOSIS — Z0000 Encounter for general adult medical examination without abnormal findings: Secondary | ICD-10-CM

## 2020-09-17 DIAGNOSIS — Z01419 Encounter for gynecological examination (general) (routine) without abnormal findings: Secondary | ICD-10-CM | POA: Diagnosis not present

## 2020-09-17 DIAGNOSIS — Z3041 Encounter for surveillance of contraceptive pills: Secondary | ICD-10-CM

## 2020-09-17 DIAGNOSIS — Z3009 Encounter for other general counseling and advice on contraception: Secondary | ICD-10-CM

## 2020-09-17 MED ORDER — LEVONORGEST-ETH ESTRAD 91-DAY 0.15-0.03 &0.01 MG PO TABS: 1.0000 | ORAL_TABLET | Freq: Every day | ORAL | 3 refills | Status: DC

## 2020-09-17 NOTE — Progress Notes (Signed)
Subjective:     Tina Jennings is a 35 y.o. female here at [facility] for a routine exam.  Current complaints: some vaginal dryness, may be OCPs or allergy medications per pt.  Personal health questionnaire reviewed: yes.  Do you have a primary care provider? Yes, appt 09/18/20 Do you feel safe at home? yes    Risk factors for chronic health problems: Smoking: Never smoker Alchohol/how much: some Pt BMI: Body mass index is 28.17 kg/m.   Gynecologic History Patient's last menstrual period was 08/24/2020. Contraception: OCP (estrogen/progesterone) Last Pap: 07/2019. Results were: normal Last mammogram: n/a.   Obstetric History OB History  Gravida Para Term Preterm AB Living  1 1   1   3   SAB IAB Ectopic Multiple Live Births        1 3    # Outcome Date GA Lbr Len/2nd Weight Sex Delivery Anes PTL Lv  1A Preterm 12/23/12 [redacted]w[redacted]d  2 lb 11.7 oz (1.24 kg) F CS-LTranv Spinal  LIV     Birth Comments: left ear tag on tragus   1B Preterm 12/23/12 [redacted]w[redacted]d  3 lb 8.4 oz (1.6 kg) F CS-LTranv Spinal  LIV  1C Preterm 12/23/12 [redacted]w[redacted]d  3 lb 6.7 oz (1.55 kg) M CS-LTranv Spinal  LIV     The following portions of the patient's history were reviewed and updated as appropriate: allergies, current medications, past family history, past medical history, past social history, past surgical history and problem list.  Review of Systems Pertinent items noted in HPI and remainder of comprehensive ROS otherwise negative.    Objective:   Resp 16   Ht 5\' 3"  (1.6 m)   Wt 159 lb (72.1 kg)   LMP 08/24/2020   BMI 28.17 kg/m  VS reviewed, nursing note reviewed,  Constitutional: well developed, well nourished, no distress HEENT: normocephalic, thyroid without enlargement or mass HEART: normal rate, heart sounds, regular rhythm RESP: normal effort, lung sounds clear and equal bilaterally Breast Exam:  Deferred with low risks and shared decision making, discussed recommendation to start mammogram between  40-50 yo/ exam  Abdomen: soft Neuro: alert and oriented x 3 Skin: warm, dry Psych: affect normal Pelvic exam:Deferred     Assessment/Plan:   1. Well woman exam (no gynecological exam) --Doing well, desires to continue OCPs --Does self breast exams periodically, no problems. Grandmothers with breast cancer both at age 35, no other family history. --Discussed vaginal dryness and multiple potential causes. Pt with menses and no other s/sx of estrogen loss so unlikely early menopause.   --Pt to f/u if persists after allergy season and use of antihistamines --Try OTC vaginal moisturizers  2. Encounter for surveillance of contraceptive pills --Renew Rx for OCPs  3. Encounter for general counseling and advice on contraceptive management --Discussed LARCs as most effective contraception and lower hormones.  Discussed their use to make menses ligher/less often.  Pt to consider in the future as she does not desire more children.    Follow up in: 1 year or as needed.   08/26/2020, CNM 1:11 PM

## 2020-09-23 ENCOUNTER — Encounter: Payer: Self-pay | Admitting: Medical-Surgical

## 2020-09-23 ENCOUNTER — Other Ambulatory Visit: Payer: Self-pay

## 2020-09-23 ENCOUNTER — Ambulatory Visit: Payer: BC Managed Care – PPO | Admitting: Medical-Surgical

## 2020-09-23 VITALS — BP 121/79 | HR 99 | Temp 98.1°F | Ht 64.0 in | Wt 160.4 lb

## 2020-09-23 DIAGNOSIS — G43009 Migraine without aura, not intractable, without status migrainosus: Secondary | ICD-10-CM

## 2020-09-23 DIAGNOSIS — Z7689 Persons encountering health services in other specified circumstances: Secondary | ICD-10-CM | POA: Diagnosis not present

## 2020-09-23 DIAGNOSIS — B001 Herpesviral vesicular dermatitis: Secondary | ICD-10-CM | POA: Diagnosis not present

## 2020-09-23 DIAGNOSIS — J4520 Mild intermittent asthma, uncomplicated: Secondary | ICD-10-CM

## 2020-09-23 MED ORDER — BUTALBITAL-APAP-CAFFEINE 50-325-40 MG PO TABS: 1.0000 | ORAL_TABLET | Freq: Two times a day (BID) | ORAL | 1 refills | Status: AC | PRN

## 2020-09-23 MED ORDER — VALACYCLOVIR HCL 1 G PO TABS: 1000.0000 mg | ORAL_TABLET | Freq: Every day | ORAL | 1 refills | Status: DC | PRN

## 2020-09-23 NOTE — Progress Notes (Signed)
Subjective:    CC: Establish care  HPI: Pleasant 35 year old female presenting to establish care today.  She was seen in our office back in October but had not fully established with a PCP.  She does have arthritis in the right knee and right hip as well as IT band syndrome.  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  She manages this with anti-inflammatories as needed for flares.  Asthma-has an albuterol inhaler that she uses as needed.  Also has Advair.  Notes having a flare every year of asthmatic bronchitis that usually occurs in the fall.  Migraines-she does have a history of migraines that she treats with Excedrin Migraine over-the-counter.  She has used Fioricet for severe migraines in the past if Excedrin Migraine was ineffective.  She does not have an aura with her migraines but she does get nausea/vomiting.  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  She got a lip filler done recently which has exacerbated her symptoms.  Has Valtrex 1000 mg to use as needed.  When she starts to feel an itch/tingle, she will take a dose.  At this has worked well and she has had no further cold sores.  I reviewed the past medical history, family history, social history, surgical history, and allergies today and no changes were needed.  Please see the problem list section below in epic for further details.  Past Medical History: Past Medical History:  Diagnosis Date  . Arthritis   . Asthma   . Herniated lumbar intervertebral disc   . Infertility   . IT band syndrome   . Low back pain   . Migraines    Past Surgical History: Past Surgical History:  Procedure Laterality Date  . CESAREAN SECTION N/A 12/23/2012   Procedure: PRIMARY CESAREAN SECTION with delivery of Triplets;  Surgeon: Lesly Dukes, MD;  Location: [facility];  Service: Obstetrics;  Laterality: N/A;   Social History: Social History   Socioeconomic History  . Marital status: Married    Spouse name: Not on file  . Number  of children: Not on file  . Years of education: Not on file  . Highest education level: Not on file  Occupational History  . Occupation: Academic librarian: [employer]  Tobacco Use  . Smoking status: Never Smoker  . Smokeless tobacco: Never Used  Vaping Use  . Vaping Use: Never used  Substance and Sexual Activity  . Alcohol use: Yes    Alcohol/week: 3.0 standard drinks    Types: 3 Standard drinks or equivalent per week  . Drug use: Never  . Sexual activity: Yes    Partners: Male    Birth control/protection: Pill  Other Topics Concern  . Not on file  Social History Narrative  . Not on file   Social Determinants of Health   Financial Resource Strain: Not on file  Food Insecurity: Not on file  Transportation Needs: Not on file  Physical Activity: Not on file  Stress: Not on file  Social Connections: Not on file   Family History: Family History  Problem Relation Age of Onset  . Diabetes Father   . Hyperlipidemia Father   . Hypertension Father   . Heart disease Maternal Grandmother   . Stroke Maternal Grandmother   . Heart attack Maternal Grandmother   . Breast cancer Maternal Grandmother   . Heart disease Maternal Grandfather   . Stroke Maternal Grandfather   . Hypertension Maternal Grandfather   .  Heart disease Paternal Grandfather   . Diabetes Paternal Grandfather   . Stroke Paternal Grandfather   . Hypertension Paternal Grandfather   . Skin cancer Mother    Allergies: No Known Allergies Medications: See med rec.  Review of Systems: See HPI for pertinent positives and negatives.   Objective:    General: Well Developed, well nourished, and in no acute distress.  Neuro: Alert and oriented x3.  HEENT: Normocephalic, atraumatic.  Skin: Warm and dry. Cardiac: Regular rate and rhythm, no murmurs rubs or gallops, no lower extremity edema.  Respiratory: Clear to auscultation bilaterally. Not using accessory muscles, speaking in full sentences.  Impression and  Recommendations:    1. Encounter to establish care With available information and discussed healthcare concerns with patient.  2. Migraine without aura and without status migrainosus, not intractable Continue with Excedrin Migraine as needed for migraines.  Sending Fioricet refills.  3. Mild intermittent asthma without complication Continue Advair and albuterol as prescribed.  4. Recurrent cold sores Continue Valtrex 1000 mg as needed, refill provided.  Return in about 6 months (around 03/26/2021) for Migraine/asthma follow-up. ___________________________________________ Thayer Ohm, DNP, APRN, FNP-BC Primary Care and Sports Medicine [facility]

## 2020-10-27 ENCOUNTER — Other Ambulatory Visit: Payer: Self-pay | Admitting: Medical-Surgical

## 2020-11-09 ENCOUNTER — Encounter: Payer: Self-pay | Admitting: Medical-Surgical

## 2020-11-19 ENCOUNTER — Other Ambulatory Visit: Payer: Self-pay | Admitting: *Deleted

## 2020-11-19 DIAGNOSIS — Z3041 Encounter for surveillance of contraceptive pills: Secondary | ICD-10-CM

## 2020-11-19 MED ORDER — LEVONORGEST-ETH ESTRAD 91-DAY 0.15-0.03 &0.01 MG PO TABS: 1.0000 | ORAL_TABLET | Freq: Every day | ORAL | 3 refills | Status: DC

## 2020-11-25 ENCOUNTER — Other Ambulatory Visit: Payer: Self-pay

## 2020-11-25 ENCOUNTER — Encounter: Payer: Self-pay | Admitting: Medical-Surgical

## 2020-11-25 ENCOUNTER — Ambulatory Visit: Payer: BC Managed Care – PPO | Admitting: Medical-Surgical

## 2020-11-25 VITALS — BP 134/83 | HR 98 | Temp 98.9°F | Resp 20 | Ht 64.0 in | Wt 160.0 lb

## 2020-11-25 DIAGNOSIS — H6502 Acute serous otitis media, left ear: Secondary | ICD-10-CM

## 2020-11-25 MED ORDER — PREDNISONE 50 MG PO TABS: 50.0000 mg | ORAL_TABLET | Freq: Every day | ORAL | 0 refills | Status: DC

## 2020-11-25 MED ORDER — CEFDINIR 300 MG PO CAPS: 300.0000 mg | ORAL_CAPSULE | Freq: Two times a day (BID) | ORAL | 0 refills | Status: DC

## 2020-11-25 NOTE — Progress Notes (Signed)
  HPI with pertinent ROS:   CC: left ear pain  HPI: Pleasant 35 year old female presenting today for evaluation of left ear pain. Starting July 6th, she had upper respiratory symptoms including sore throat, ear pain, cough, and sinus congestion. She did do a tele-doc visit and was prescribed Augmentin for 5 days. She saw a bit of improvement in her symptoms but the left ear pain has remained. Her throat is still scratchy and she has a bit of a globus sensation. Was unable to get the ear drops that were prescribed for her by the tele-doc but did use some that were prescribed for her son, no relief. No fevers, chills, chest pain, shortness of breath, and GI symptoms.   I reviewed the past medical history, family history, social history, surgical history, and allergies today and no changes were needed.  Please see the problem list section below in epic for further details.  Physical exam:   General: Well Developed, well nourished, and in no acute distress.  Neuro: Alert and oriented x3.  HEENT: Normocephalic, atraumatic. Right TM intact, pearly gray with intact cone of light. Left TM slightly bulged with cloudy fluid visible. Left ear canal erythematous.   Skin: Warm and dry. Cardiac: Regular rate and rhythm, no murmurs rubs or gallops, no lower extremity edema.  Respiratory: Clear to auscultation bilaterally. Not using accessory muscles, speaking in full sentences.  Impression and Recommendations:    1. Non-recurrent acute serous otitis media of left ear Omnicef 300mg  BID x 10 days. Prednisone 50mg  daily x 5 days. Continue Flonase as prescribed, reviewed correct administration technique.   Return if symptoms worsen or fail to improve. ___________________________________________ , DNP, APRN, FNP-BC Primary Care and Sports Medicine [facility] [place]

## 2021-01-08 ENCOUNTER — Encounter: Payer: Self-pay | Admitting: Family Medicine

## 2021-01-08 ENCOUNTER — Telehealth (INDEPENDENT_AMBULATORY_CARE_PROVIDER_SITE_OTHER): Payer: BC Managed Care – PPO | Admitting: Family Medicine

## 2021-01-08 ENCOUNTER — Encounter: Payer: Self-pay | Admitting: Medical-Surgical

## 2021-01-08 DIAGNOSIS — U071 COVID-19: Secondary | ICD-10-CM | POA: Diagnosis not present

## 2021-01-08 MED ORDER — BENZONATATE 200 MG PO CAPS: 200.0000 mg | ORAL_CAPSULE | Freq: Two times a day (BID) | ORAL | 0 refills | Status: DC | PRN

## 2021-01-08 MED ORDER — NIRMATRELVIR/RITONAVIR (PAXLOVID)TABLET: 3.0000 | ORAL_TABLET | Freq: Two times a day (BID) | ORAL | 0 refills | Status: AC

## 2021-01-08 MED ORDER — PREDNISONE 50 MG PO TABS: 50.0000 mg | ORAL_TABLET | Freq: Every day | ORAL | 0 refills | Status: DC

## 2021-01-08 NOTE — Assessment & Plan Note (Signed)
History of asthma with increased wheezing and shortness of breath.  Starting Paxlovid at standard dosing.  We will also add course of prednisone and Tessalon Perles.  Continue Advair and albuterol.  Encouraged to continue to push fluids.  Add vitamin D and vitamin C for immune support.  Red flags discussed including reasons to seek emergency care.

## 2021-01-08 NOTE — Progress Notes (Signed)
Tina Jennings - 35 y.o. female MRN [id number]  Date of birth: [birth year]   This visit type was conducted due to national recommendations for restrictions regarding the COVID-19 Pandemic (e.g. social distancing).  This format is felt to be most appropriate for this patient at this time.  All issues noted in this document were discussed and addressed.  No physical exam was performed (except for noted visual exam findings with Video Visits).  I discussed the limitations of evaluation and management by telemedicine and the availability of in person appointments. The patient expressed understanding and agreed to proceed.  I connected withNAME@ on 01/08/21 at  3:30 PM EDT by a video enabled telemedicine application and verified that I am speaking with the correct person using two identifiers.  Present at visit: Everrett Coombe, DO Maury Dus   Patient Location: Home [address] [place] [address]   Provider location:   [facility]  Chief Complaint  Patient presents with   Covid Positive    Tested positive yesterday, sx onset 3 days ago, children tested positive 2 days ago, HA, chest tightness, non-productive cough, progressively getting worse, "feels like a rubber band is squeezing my chest", has been using OTC cough syrups and drops as well as her inhalers    HPI  Tina Jennings is a 35 y.o. female who presents via audio/video conferencing for a telehealth visit today.  She tested positive for COVID yesterday.  Symptom onset was 4 days ago.  Initially started as a headache with progression to respiratory symptoms including cough, wheezing and shortness of breath.  She is not having significant difficulty breathing.  She does have a history of asthma and has Advair and albuterol inhalers which she is using already.  She has taking over-the-counter cold medication as well as cough syrup.  She has not gotten significant relief from this.  She is eating and drinking normally.   ROS:   A comprehensive ROS was completed and negative except as noted per HPI  Past Medical History:  Diagnosis Date   Arthritis    Asthma    Herniated lumbar intervertebral disc    Infertility    IT band syndrome    Low back pain    Migraines     Past Surgical History:  Procedure Laterality Date   CESAREAN SECTION N/A 12/23/2012   Procedure: PRIMARY CESAREAN SECTION with delivery of Triplets;  Surgeon: Lesly Dukes, MD;  Location: [facility];  Service: Obstetrics;  Laterality: N/A;    Family History  Problem Relation Age of Onset   Diabetes Father    Hyperlipidemia Father    Hypertension Father    Heart disease Maternal Grandmother    Stroke Maternal Grandmother    Heart attack Maternal Grandmother    Breast cancer Maternal Grandmother    Heart disease Maternal Grandfather    Stroke Maternal Grandfather    Hypertension Maternal Grandfather    Heart disease Paternal Grandfather    Diabetes Paternal Grandfather    Stroke Paternal Grandfather    Hypertension Paternal Grandfather    Skin cancer Mother     Social History   Socioeconomic History   Marital status: Married    Spouse name: Not on file   Number of children: Not on file   Years of education: Not on file   Highest education level: Not on file  Occupational History   Occupation: Nurse    Employer: [employer]  Tobacco Use   Smoking status: Never  Smokeless tobacco: Never  Vaping Use   Vaping Use: Never used  Substance and Sexual Activity   Alcohol use: Yes    Alcohol/week: 3.0 standard drinks    Types: 3 Standard drinks or equivalent per week   Drug use: Never   Sexual activity: Yes    Partners: Male    Birth control/protection: Pill  Other Topics Concern   Not on file  Social History Narrative   Not on file   Social Determinants of Health   Financial Resource Strain: Not on file  Food Insecurity: Not on file  Transportation Needs: Not on file  Physical Activity: Not on file  Stress: Not on file   Social Connections: Not on file  Intimate Partner Violence: Not on file     Current Outpatient Medications:    acetaminophen (TYLENOL) 325 MG tablet, Take 650 mg by mouth every 6 (six) hours as needed for pain., Disp: , Rfl:    albuterol (VENTOLIN HFA) 108 (90 Base) MCG/ACT inhaler, Inhale 2 puffs into the lungs every 4 (four) hours as needed for wheezing or shortness of breath., Disp: 18 g, Rfl: 0   aspirin-acetaminophen-caffeine (EXCEDRIN MIGRAINE) 250-250-65 MG tablet, Take 1 tablet by mouth every 6 (six) hours as needed for headache., Disp: , Rfl:    benzonatate (TESSALON) 200 MG capsule, Take 1 capsule (200 mg total) by mouth 2 (two) times daily as needed for cough., Disp: 30 capsule, Rfl: 0   butalbital-acetaminophen-caffeine (FIORICET) 50-325-40 MG tablet, Take 1 tablet by mouth 2 (two) times daily as needed for headache., Disp: 14 tablet, Rfl: 1   cefdinir (OMNICEF) 300 MG capsule, Take 1 capsule (300 mg total) by mouth 2 (two) times daily., Disp: 20 capsule, Rfl: 0   COLLAGEN PO, Take by mouth daily., Disp: , Rfl:    Fluticasone-Salmeterol (ADVAIR) 100-50 MCG/DOSE AEPB, Inhale 1 puff into the lungs 2 (two) times daily., Disp: 60 each, Rfl: 1   ibuprofen (ADVIL,MOTRIN) 600 MG tablet, Take 1 tablet (600 mg total) by mouth every 6 (six) hours as needed for pain., Disp: 30 tablet, Rfl: 0   Levonorgestrel-Ethinyl Estradiol (AMETHIA) 0.15-0.03 &0.01 MG tablet, Take 1 tablet by mouth daily., Disp: 90 tablet, Rfl: 3   Multiple Vitamin (MULTIVITAMIN) capsule, Take 1 capsule by mouth daily., Disp: , Rfl:    nirmatrelvir/ritonavir EUA (PAXLOVID) 20 x 150 MG & 10 x 100MG  TABS, Take 3 tablets by mouth 2 (two) times daily for 5 days. (Take nirmatrelvir 150 mg two tablets twice daily for 5 days and ritonavir 100 mg one tablet twice daily for 5 days) Patient GFR is 116, Disp: 30 tablet, Rfl: 0   Omega-3 Fatty Acids (FISH OIL) 1000 MG CAPS, Take 1 capsule by mouth daily., Disp: , Rfl:    predniSONE  (DELTASONE) 50 MG tablet, Take 1 tablet (50 mg total) by mouth daily., Disp: 5 tablet, Rfl: 0   valACYclovir (VALTREX) 1000 MG tablet, TAKE 1 TABLET (1,000 MG TOTAL) BY MOUTH DAILY AS NEEDED., Disp: 30 tablet, Rfl: 1  EXAM:  VITALS per patient if applicable: There were no vitals taken for this visit.  GENERAL: alert, oriented, appears ill but nontoxic.  HEENT: atraumatic, conjunttiva clear, no obvious abnormalities on inspection of external nose and ears  NECK: normal movements of the head and neck  LUNGS: on inspection no signs of respiratory distress, breathing rate appears normal, no obvious gross SOB, gasping or wheezing  CV: no obvious cyanosis  MS: moves all visible extremities without noticeable abnormality  PSYCH/NEURO: pleasant and cooperative, no obvious depression or anxiety, speech and thought processing grossly intact  ASSESSMENT AND PLAN:  Discussed the following assessment and plan:  COVID-19 History of asthma with increased wheezing and shortness of breath.  Starting Paxlovid at standard dosing.  We will also add course of prednisone and Tessalon Perles.  Continue Advair and albuterol.  Encouraged to continue to push fluids.  Add vitamin D and vitamin C for immune support.  Red flags discussed including reasons to seek emergency care.     I discussed the assessment and treatment plan with the patient. The patient was provided an opportunity to ask questions and all were answered. The patient agreed with the plan and demonstrated an understanding of the instructions.   The patient was advised to call back or seek an in-person evaluation if the symptoms worsen or if the condition fails to improve as anticipated.    Everrett Coombe, DO

## 2021-03-26 ENCOUNTER — Ambulatory Visit: Payer: BC Managed Care – PPO | Admitting: Medical-Surgical

## 2021-06-09 ENCOUNTER — Encounter: Payer: Self-pay | Admitting: Medical-Surgical

## 2021-06-09 DIAGNOSIS — R059 Cough, unspecified: Secondary | ICD-10-CM

## 2021-06-09 MED ORDER — MELOXICAM 15 MG PO TABS: ORAL_TABLET | ORAL | 3 refills | Status: DC

## 2021-07-01 ENCOUNTER — Other Ambulatory Visit: Payer: Self-pay | Admitting: Medical-Surgical

## 2021-08-06 ENCOUNTER — Other Ambulatory Visit: Payer: Self-pay | Admitting: Medical-Surgical

## 2021-08-20 ENCOUNTER — Other Ambulatory Visit: Payer: Self-pay | Admitting: Medical-Surgical

## 2021-09-09 ENCOUNTER — Encounter: Payer: Self-pay | Admitting: Family Medicine

## 2021-09-09 ENCOUNTER — Ambulatory Visit: Payer: BC Managed Care – PPO | Admitting: Family Medicine

## 2021-09-09 VITALS — BP 129/85 | HR 97 | Temp 98.3°F | Ht 64.0 in | Wt 169.0 lb

## 2021-09-09 DIAGNOSIS — J029 Acute pharyngitis, unspecified: Secondary | ICD-10-CM | POA: Diagnosis not present

## 2021-09-09 DIAGNOSIS — J020 Streptococcal pharyngitis: Secondary | ICD-10-CM | POA: Diagnosis not present

## 2021-09-09 LAB — POCT RAPID STREP A (OFFICE): Rapid Strep A Screen: POSITIVE — AB

## 2021-09-09 MED ORDER — AMOXICILLIN 500 MG PO CAPS: 500.0000 mg | ORAL_CAPSULE | Freq: Two times a day (BID) | ORAL | 0 refills | Status: AC

## 2021-09-09 NOTE — Patient Instructions (Signed)
Strep Throat, Adult Strep throat is an infection of the throat. It is caused by germs (bacteria). Strep throat is common during the cold months of the year. It mostly affects children who are 5-36 years old. However, people of all ages can get it at any time of the year. This infection spreads from person to person through coughing, sneezing, or having close contact. What are the causes? This condition is caused by the Streptococcus pyogenes germ. What increases the risk? You care for young children. Children are more likely to get strep throat and may spread it to others. You go to crowded places. Germs can spread easily in such places. You kiss or touch someone who has strep throat. What are the signs or symptoms? Fever or chills. Redness, swelling, or pain in the tonsils or throat. Pain or trouble when swallowing. White or yellow spots on the tonsils or throat. Tender glands in the neck and under the jaw. Bad breath. Red rash all over the body. This is rare. How is this treated? Medicines that kill germs (antibiotics). Medicines that treat pain or fever. These include: Ibuprofen or acetaminophen. Aspirin, only for people who are over the age of 18. Cough drops. Throat sprays. Follow these instructions at home: Medicines  Take over-the-counter and prescription medicines only as told by your doctor. Take your antibiotic medicine as told by your doctor. Do not stop taking the antibiotic even if you start to feel better. Eating and drinking  If you have trouble swallowing, eat soft foods until your throat feels better. Drink enough fluid to keep your pee (urine) pale yellow. To help with pain, you may have: Warm fluids, such as soup and tea. Cold fluids, such as frozen desserts or popsicles. General instructions Rinse your mouth (gargle) with a salt-water mixture 3-4 times a day or as needed. To make a salt-water mixture, dissolve -1 tsp (3-6 g) of salt in 1 cup (237 mL) of warm  water. Rest as much as you can. Stay home from work or school until you have been taking antibiotics for 24 hours. Do not smoke or use any products that contain nicotine or tobacco. If you need help quitting, ask your doctor. Keep all follow-up visits. How is this prevented?  Do not share food, drinking cups, or personal items. They can cause the germs to spread. Wash your hands well with soap and water. Make sure that all people in your house wash their hands well. Have family members tested if they have a fever or a sore throat. They may need an antibiotic if they have strep throat. Contact a doctor if: You have swelling in your neck that keeps getting bigger. You get a rash, cough, or earache. You cough up a thick fluid that is green, yellow-brown, or bloody. You have pain that does not get better with medicine. Your symptoms get worse instead of getting better. You have a fever. Get help right away if: You vomit. You have a very bad headache. Your neck hurts or feels stiff. You have chest pain or are short of breath. You have drooling, very bad throat pain, or changes in your voice. Your neck is swollen, or the skin gets red and tender. Your mouth is dry, or you are peeing less than normal. You keep feeling more tired or have trouble waking up. Your joints are red or painful. These symptoms may be an emergency. Do not wait to see if the symptoms will go away. Get help right away. Call   your local emergency services (911 in the [place] Summary Strep throat is an infection of the throat. It is caused by germs (bacteria). This infection can spread from person to person through coughing, sneezing, or having close contact. Take your medicines, including antibiotics, as told by your doctor. Do not stop taking the antibiotic even if you start to feel better. To prevent the spread of germs, wash your hands well with soap and water. Have others do the same. Do not share food, drinking cups,  or personal items. Get help right away if you have a bad headache, chest pain, shortness of breath, a stiff or painful neck, or you vomit. This information is not intended to replace advice given to you by your health care provider. Make sure you discuss any questions you have with your health care provider. Document Revised: 08/13/2020 Document Reviewed: 08/13/2020 Elsevier Patient Education  2023 [employer]  

## 2021-09-09 NOTE — Progress Notes (Signed)
?Tina Jennings - 36 y.o. female MRN [id number]  Date of birth: 1986/04/11 ? ?Subjective ?Chief Complaint  ?Patient presents with  ? Sore Throat  ? ? ?HPI ?Tina Jennings is a 36 y.o. female here today with complaint of sore throat.  She has had sore throat and painful swallowing x3 days.  Symptoms were fairly rapid onset within a few hours.  She has noted "white spots" in the back of her throat.  She has had decreased appetite and headaches.  Doesn't think she has had fever.  Has tried OTC cold combination medications without much relief.  ? ?ROS:  A comprehensive ROS was completed and negative except as noted per HPI ? ?No Known Allergies ? ?Past Medical History:  ?Diagnosis Date  ? Arthritis   ? Asthma   ? Herniated lumbar intervertebral disc   ? Infertility   ? IT band syndrome   ? Low back pain   ? Migraines   ? ? ?Past Surgical History:  ?Procedure Laterality Date  ? CESAREAN SECTION N/A 12/23/2012  ? Procedure: PRIMARY CESAREAN SECTION with delivery of Triplets;  Surgeon: Lesly Dukes, MD;  Location: [facility];  Service: Obstetrics;  Laterality: N/A;  ? ? ?Social History  ? ?Socioeconomic History  ? Marital status: Married  ?  Spouse name: Not on file  ? Number of children: Not on file  ? Years of education: Not on file  ? Highest education level: Not on file  ?Occupational History  ? Occupation: Nurse  ?  Employer: [employer]  ?Tobacco Use  ? Smoking status: Never  ? Smokeless tobacco: Never  ?Vaping Use  ? Vaping Use: Never used  ?Substance and Sexual Activity  ? Alcohol use: Yes  ?  Alcohol/week: 3.0 standard drinks  ?  Types: 3 Standard drinks or equivalent per week  ? Drug use: Never  ? Sexual activity: Yes  ?  Partners: Male  ?  Birth control/protection: Pill  ?Other Topics Concern  ? Not on file  ?Social History Narrative  ? Not on file  ? ?Social Determinants of Health  ? ?Financial Resource Strain: Not on file  ?Food Insecurity: Not on file  ?Transportation Needs: Not on file  ?Physical Activity:  Not on file  ?Stress: Not on file  ?Social Connections: Not on file  ? ? ?Family History  ?Problem Relation Age of Onset  ? Diabetes Father   ? Hyperlipidemia Father   ? Hypertension Father   ? Heart disease Maternal Grandmother   ? Stroke Maternal Grandmother   ? Heart attack Maternal Grandmother   ? Breast cancer Maternal Grandmother   ? Heart disease Maternal Grandfather   ? Stroke Maternal Grandfather   ? Hypertension Maternal Grandfather   ? Heart disease Paternal Grandfather   ? Diabetes Paternal Grandfather   ? Stroke Paternal Grandfather   ? Hypertension Paternal Grandfather   ? Skin cancer Mother   ? ? ?Health Maintenance  ?Topic Date Due  ? COVID-19 Vaccine (4 - Booster for Pfizer series) 01/02/2022 (Originally 03/29/2020)  ? Hepatitis C Screening  09/10/2022 (Originally 02/27/2004)  ? INFLUENZA VACCINE  12/02/2021  ? PAP SMEAR-Modifier  07/28/2022  ? TETANUS/TDAP  11/26/2022  ? HIV Screening  Completed  ? HPV VACCINES  Aged Out  ? ? ? ?----------------------------------------------------------------------------------------------------------------------------------------------------------------------------------------------------------------- ?Physical Exam ?BP 129/85 (BP Location: Left Arm, Patient Position: Sitting, Cuff Size: Normal)   Pulse 97   Temp 98.3 ?F (36.8 ?C) (Oral)   Ht 5\' 4"  (1.626  m)   Wt 169 lb (76.7 kg)   SpO2 97%   BMI 29.01 kg/m?  ? ?Physical Exam ?Constitutional:   ?   Appearance: She is well-developed.  ?HENT:  ?   Right Ear: Tympanic membrane and ear canal normal.  ?   Left Ear: Tympanic membrane and ear canal normal.  ?   Mouth/Throat:  ?   Pharynx: Posterior oropharyngeal erythema present.  ?   Tonsils: Tonsillar exudate present. 2+ on the right. 1+ on the left.  ?Cardiovascular:  ?   Rate and Rhythm: Normal rate and regular rhythm.  ?Lymphadenopathy:  ?   Cervical: Cervical adenopathy present.  ?Neurological:  ?   Mental Status: She is alert.   ? ? ?------------------------------------------------------------------------------------------------------------------------------------------------------------------------------------------------------------------- ?Assessment and Plan ? ?Strep pharyngitis ?Rapid strep test positive.  Treating with amoxicilline 500mg  bid x10 days.  Recommend warm salt water gargles, OTC analgesics and may use chloraseptic spray/cepacol if needed.  Contact clinic if not improving or if symptoms worsen.   ? ? ?Meds ordered this encounter  ?Medications  ? amoxicillin (AMOXIL) 500 MG capsule  ?  Sig: Take 1 capsule (500 mg total) by mouth 2 (two) times daily for 10 days.  ?  Dispense:  20 capsule  ?  Refill:  0  ? ? ?No follow-ups on file. ? ? ? ?This visit occurred during the SARS-CoV-2 public health emergency.  Safety protocols were in place, including screening questions prior to the visit, additional usage of staff PPE, and extensive cleaning of exam room while observing appropriate contact time as indicated for disinfecting solutions.  ? ?

## 2021-09-09 NOTE — Assessment & Plan Note (Signed)
Rapid strep test positive.  Treating with amoxicilline 500mg  bid x10 days.  Recommend warm salt water gargles, OTC analgesics and may use chloraseptic spray/cepacol if needed.  Contact clinic if not improving or if symptoms worsen.   ?

## 2021-11-21 ENCOUNTER — Ambulatory Visit (INDEPENDENT_AMBULATORY_CARE_PROVIDER_SITE_OTHER): Payer: BC Managed Care – PPO | Admitting: Certified Nurse Midwife

## 2021-11-21 ENCOUNTER — Encounter: Payer: Self-pay | Admitting: Certified Nurse Midwife

## 2021-11-21 VITALS — BP 122/82 | HR 102 | Ht 63.0 in | Wt 169.0 lb

## 2021-11-21 DIAGNOSIS — Z01419 Encounter for gynecological examination (general) (routine) without abnormal findings: Secondary | ICD-10-CM

## 2021-11-21 DIAGNOSIS — Z3041 Encounter for surveillance of contraceptive pills: Secondary | ICD-10-CM

## 2021-11-21 MED ORDER — LEVONORGEST-ETH ESTRAD 91-DAY 0.15-0.03 &0.01 MG PO TABS: 1.0000 | ORAL_TABLET | Freq: Every day | ORAL | 3 refills | Status: DC

## 2021-11-21 NOTE — Progress Notes (Signed)
Gynecology Annual Exam   History of Present Illness: Tina Jennings is a 36 y.o. married female presenting for an annual exam. She has  no complaints today. She is sexually active. She denies dyspareunia. She does perform self breast exams. There is no notable family history of breast or ovarian cancer in her family.   Past Medical History:  Past Medical History:  Diagnosis Date   Arthritis    Asthma    Herniated lumbar intervertebral disc    Infertility    IT band syndrome    Low back pain    Migraines     Past Surgical History:  Past Surgical History:  Procedure Laterality Date   CESAREAN SECTION N/A 12/23/2012   Procedure: PRIMARY CESAREAN SECTION with delivery of Triplets;  Surgeon: Lesly Dukes, MD;  Location: [facility];  Service: Obstetrics;  Laterality: N/A;    Gynecologic History:  LMP: Patient's last menstrual period was 09/17/2021 (approximate). Average Interval: regular Heavy Menses: no Clots: no Intermenstrual Bleeding: no Postcoital Bleeding: no Dysmenorrhea: no Contraception: OCP (estrogen/progesterone) Last Pap: completed on 07/28/19 ; result was: NIL and HR HPV negative  Mammogram: n/a  Obstetric History: G1P0103  Family History:  Family History  Problem Relation Age of Onset   Diabetes Father    Hyperlipidemia Father    Hypertension Father    Heart disease Maternal Grandmother    Stroke Maternal Grandmother    Heart attack Maternal Grandmother    Breast cancer Maternal Grandmother    Heart disease Maternal Grandfather    Stroke Maternal Grandfather    Hypertension Maternal Grandfather    Heart disease Paternal Grandfather    Diabetes Paternal Grandfather    Stroke Paternal Grandfather    Hypertension Paternal Grandfather    Skin cancer Mother     Social History:  Social History   Socioeconomic History   Marital status: Married    Spouse name: Not on file   Number of children: Not on file   Years of education: Not on file   Highest  education level: Not on file  Occupational History   Occupation: Nurse    Employer: [employer]  Tobacco Use   Smoking status: Never   Smokeless tobacco: Never  Vaping Use   Vaping Use: Never used  Substance and Sexual Activity   Alcohol use: Yes    Alcohol/week: 3.0 standard drinks of alcohol    Types: 3 Standard drinks or equivalent per week   Drug use: Never   Sexual activity: Yes    Partners: Male    Birth control/protection: Pill  Other Topics Concern   Not on file  Social History Narrative   Not on file   Social Determinants of Health   Financial Resource Strain: Not on file  Food Insecurity: Not on file  Transportation Needs: Not on file  Physical Activity: Not on file  Stress: Not on file  Social Connections: Not on file  Intimate Partner Violence: Not on file    Allergies:  No Known Allergies  Medications: Prior to Admission medications   Medication Sig Start Date End Date Taking? Authorizing Provider  acetaminophen (TYLENOL) 325 MG tablet Take 650 mg by mouth every 6 (six) hours as needed for pain.   Yes [provider]  albuterol (VENTOLIN HFA) 108 (90 Base) MCG/ACT inhaler Inhale 2 puffs into the lungs every 4 (four) hours as needed for wheezing or shortness of breath. 02/11/19  Yes Lajean Manes, MD  aspirin-acetaminophen-caffeine [facility] MIGRAINE) [phone] MG tablet  Take 1 tablet by mouth every 6 (six) hours as needed for headache.   Yes [provider]  butalbital-acetaminophen-caffeine (FIORICET) 50-325-40 MG tablet Take 1 tablet by mouth 2 (two) times daily as needed for headache. 09/23/20  Yes Christen Butter, NP  COLLAGEN PO Take by mouth daily.   Yes [provider]  Fluticasone-Salmeterol (ADVAIR) 100-50 MCG/DOSE AEPB Inhale 1 puff into the lungs 2 (two) times daily. 02/13/20  Yes Lattie Haw, MD  ibuprofen (ADVIL,MOTRIN) 600 MG tablet Take 1 tablet (600 mg total) by mouth every 6 (six) hours as needed for pain. 12/25/12   Yes Adam Phenix, MD  Levonorgestrel-Ethinyl Estradiol (AMETHIA) 0.15-0.03 &0.01 MG tablet Take 1 tablet by mouth daily. 11/19/20  Yes Leftwich-Kirby, Wilmer Floor, CNM  meloxicam (MOBIC) 15 MG tablet One half to 1 full tab PO qAM with breakfast for 2 weeks, then daily prn pain. 06/09/21  Yes Christen Butter, NP  Multiple Vitamin (MULTIVITAMIN) capsule Take 1 capsule by mouth daily.   Yes [provider]  Omega-3 Fatty Acids (FISH OIL) 1000 MG CAPS Take 1 capsule by mouth daily.   Yes [provider]  valACYclovir (VALTREX) 1000 MG tablet TAKE 1 TABLET (1,000 MG TOTAL) BY MOUTH DAILY AS NEEDED. NEEDS APPOINTMENT FOR FURTHER REFILLS 08/21/21  Yes Christen Butter, NP    Review of Systems: negative except noted in HPI  Physical Exam Vitals: BP 122/82   Pulse (!) 102   Ht 5\' 3"  (1.6 m)   Wt 169 lb (76.7 kg)   LMP 09/17/2021 (Approximate)   BMI 29.94 kg/m  General: NAD HEENT: normocephalic, atraumatic Thyroid: no enlargement, no palpable nodules Pulmonary: Normal rate and effort, CTAB Cardiovascular: RRR Breast: Breast symmetrical, no tenderness, no palpable nodules or masses, no skin or nipple retraction present, no nipple discharge. No axillary or supraclavicular lymphadenopathy. Abdomen: soft, non-tender, non-distended. No hepatomegaly, splenomegaly or masses palpable. No evidence of hernia  Genitourinary:deferred Extremities: no edema, erythema, or tenderness Neurologic: Grossly intact Psychiatric: mood appropriate, affect full Female chaperone present for pelvic and breast portions of the physical exam  No results found for this or any previous visit (from the past 24 hour(s)).  Assessment:  1. Well woman exam   2. Oral contraceptive pill surveillance    Plan: Rx Amethia Follow up with GYN in 1 year or prn Follow up with PCP as scheduled  09/19/2021, CNM 11/21/2021 8:29 AM

## 2022-02-16 ENCOUNTER — Encounter: Payer: Self-pay | Admitting: Certified Nurse Midwife

## 2022-10-12 ENCOUNTER — Other Ambulatory Visit: Payer: Self-pay | Admitting: Certified Nurse Midwife

## 2022-10-12 DIAGNOSIS — Z3041 Encounter for surveillance of contraceptive pills: Secondary | ICD-10-CM

## 2022-12-14 ENCOUNTER — Other Ambulatory Visit: Payer: Self-pay | Admitting: Certified Nurse Midwife

## 2022-12-14 DIAGNOSIS — Z3041 Encounter for surveillance of contraceptive pills: Secondary | ICD-10-CM

## 2022-12-29 ENCOUNTER — Encounter: Payer: Self-pay | Admitting: Obstetrics and Gynecology

## 2022-12-29 ENCOUNTER — Other Ambulatory Visit (HOSPITAL_COMMUNITY)
Admission: RE | Admit: 2022-12-29 | Discharge: 2022-12-29 | Disposition: A | Discharge status: Home / Self Care | Payer: BC Managed Care – PPO | Source: Ambulatory Visit | Attending: Obstetrics and Gynecology | Admitting: Obstetrics and Gynecology

## 2022-12-29 ENCOUNTER — Ambulatory Visit (INDEPENDENT_AMBULATORY_CARE_PROVIDER_SITE_OTHER): Payer: BC Managed Care – PPO | Admitting: Obstetrics and Gynecology

## 2022-12-29 VITALS — BP 127/87 | HR 112 | Ht 63.0 in | Wt 181.0 lb

## 2022-12-29 DIAGNOSIS — Z01419 Encounter for gynecological examination (general) (routine) without abnormal findings: Secondary | ICD-10-CM | POA: Insufficient documentation

## 2022-12-29 DIAGNOSIS — Z3041 Encounter for surveillance of contraceptive pills: Secondary | ICD-10-CM

## 2022-12-29 MED ORDER — LEVONORGEST-ETH ESTRAD 91-DAY 0.15-0.03 &0.01 MG PO TABS: 1.0000 | ORAL_TABLET | Freq: Every day | ORAL | 3 refills | Status: DC

## 2022-12-29 NOTE — Progress Notes (Signed)
GYNECOLOGY ANNUAL PREVENTATIVE CARE ENCOUNTER NOTE  History:     Tina Jennings is a 37 y.o. G28P0103 female here for a routine annual gynecologic exam.  Current complaints: None.   Denies abnormal vaginal bleeding, discharge, pelvic pain, problems with intercourse or other gynecologic concerns.    Gynecologic History No LMP recorded. (Menstrual status: Oral contraceptives). Contraception: OCP (estrogen/progesterone) Last Pap: 2021. Result was normal with negative HPV Last Mammogram: NA  Obstetric History OB History  Gravida Para Term Preterm AB Living  1 1   1   3   SAB IAB Ectopic Multiple Live Births        1 3    # Outcome Date GA Lbr Len/2nd Weight Sex Type Anes PTL Lv  1A Preterm 12/23/12 [redacted]w[redacted]d  2 lb 11.7 oz (1.24 kg) F CS-LTranv Spinal  LIV     Birth Comments: left ear tag on tragus   1B Preterm 12/23/12 [redacted]w[redacted]d  3 lb 8.4 oz (1.6 kg) F CS-LTranv Spinal  LIV  1C Preterm 12/23/12 [redacted]w[redacted]d  3 lb 6.7 oz (1.55 kg) M CS-LTranv Spinal  LIV    Past Medical History:  Diagnosis Date   Arthritis    Asthma    Herniated lumbar intervertebral disc    Infertility    IT band syndrome    Low back pain    Migraines     Past Surgical History:  Procedure Laterality Date   CESAREAN SECTION N/A 12/23/2012   Procedure: PRIMARY CESAREAN SECTION with delivery of Triplets;  Surgeon: Lesly Dukes, MD;  Location: [facility];  Service: Obstetrics;  Laterality: N/A;    Current Outpatient Medications on File Prior to Visit  Medication Sig Dispense Refill   acetaminophen (TYLENOL) 325 MG tablet Take 650 mg by mouth every 6 (six) hours as needed for pain.     albuterol (VENTOLIN HFA) 108 (90 Base) MCG/ACT inhaler Inhale 2 puffs into the lungs every 4 (four) hours as needed for wheezing or shortness of breath. 18 g 0   aspirin-acetaminophen-caffeine (EXCEDRIN MIGRAINE) 250-250-65 MG tablet Take 1 tablet by mouth every 6 (six) hours as needed for headache.     butalbital-acetaminophen-caffeine  (FIORICET) 50-325-40 MG tablet Take 1 tablet by mouth 2 (two) times daily as needed for headache. 14 tablet 1   COLLAGEN PO Take by mouth daily.     Fluticasone-Salmeterol (ADVAIR) 100-50 MCG/DOSE AEPB Inhale 1 puff into the lungs 2 (two) times daily. 60 each 1   ibuprofen (ADVIL,MOTRIN) 600 MG tablet Take 1 tablet (600 mg total) by mouth every 6 (six) hours as needed for pain. 30 tablet 0   Levonorgestrel-Ethinyl Estradiol (AMETHIA) 0.15-0.03 &0.01 MG tablet Take 1 tablet by mouth daily. 90 tablet 3   Multiple Vitamin (MULTIVITAMIN) capsule Take 1 capsule by mouth daily.     Omega-3 Fatty Acids (FISH OIL) 1000 MG CAPS Take 1 capsule by mouth daily.     valACYclovir (VALTREX) 1000 MG tablet TAKE 1 TABLET (1,000 MG TOTAL) BY MOUTH DAILY AS NEEDED. NEEDS APPOINTMENT FOR FURTHER REFILLS 7 tablet 0   No current facility-administered medications on file prior to visit.    No Known Allergies  Social History:  reports that she has never smoked. She has never used smokeless tobacco. She reports current alcohol use of about 3.0 standard drinks of alcohol per week. She reports that she does not use drugs.  Family History  Problem Relation Age of Onset   Diabetes Father    Hyperlipidemia Father  Hypertension Father    Heart disease Maternal Grandmother    Stroke Maternal Grandmother    Heart attack Maternal Grandmother    Breast cancer Maternal Grandmother    Heart disease Maternal Grandfather    Stroke Maternal Grandfather    Hypertension Maternal Grandfather    Heart disease Paternal Grandfather    Diabetes Paternal Grandfather    Stroke Paternal Grandfather    Hypertension Paternal Grandfather    Skin cancer Mother     The following portions of the patient's history were reviewed and updated as appropriate: allergies, current medications, past family history, past medical history, past social history, past surgical history and problem list.  Review of Systems Pertinent items noted in  HPI and remainder of comprehensive ROS otherwise negative.  Physical Exam:  BP 127/87   Pulse (!) 112   Ht 5\' 3"  (1.6 m)   Wt 181 lb (82.1 kg)   BMI 32.06 kg/m  CONSTITUTIONAL: Well-developed, well-nourished female in no acute distress.  HENT:  Normocephalic, atraumatic, External right and left ear normal.  EYES: Conjunctivae and EOM are normal. Pupils are equal, round, and reactive to light. No scleral icterus.  NECK: Normal range of motion, supple, no masses.  Normal thyroid.  SKIN: Skin is warm and dry. No rash noted. Not diaphoretic. No erythema. No pallor. MUSCULOSKELETAL: Normal range of motion. No tenderness.  No cyanosis, clubbing, or edema. NEUROLOGIC: Alert and oriented to person, place, and time. Normal reflexes, muscle tone coordination.  PSYCHIATRIC: Normal mood and affect. Normal behavior. Normal judgment and thought content. CARDIOVASCULAR: Normal heart rate noted, regular rhythm RESPIRATORY: Clear to auscultation bilaterally. Effort and breath sounds normal, no problems with respiration noted. BREASTS: Symmetric in size. No masses, tenderness, skin changes, nipple drainage, or lymphadenopathy bilaterally. Performed in the presence of a chaperone. ABDOMEN: Soft, no distention noted.  No tenderness, rebound or guarding.  PELVIC: Normal appearing external genitalia and urethral meatus; normal appearing vaginal mucosa and cervix.  No abnormal vaginal discharge noted.  Pap smear obtained.  Normal uterine size, no other palpable masses, no uterine or adnexal tenderness.  Performed in the presence of a chaperone.   Assessment and Plan:   1. Women's annual routine gynecological examination  - Cytology - PAP( Gray) - Mammo at 40  2. Oral contraceptive pill surveillance  - Levonorgestrel-Ethinyl Estradiol (AMETHIA) 0.15-0.03 &0.01 MG tablet; Take 1 tablet by mouth daily.  Dispense: 90 tablet; Refill: 3   Will follow up results of pap smear and manage  accordingly. Normal breast examination today, she was advised to perform periodic self breast examinations.   Routine preventative health maintenance measures emphasized. Please refer to After Visit Summary for other counseling recommendations.    Micheala Morissette, Harolyn Rutherford, NP [facility] [employer], [facility]

## 2023-01-07 LAB — CYTOLOGY - PAP
Comment: NEGATIVE
Diagnosis: NEGATIVE
High risk HPV: NEGATIVE

## 2023-11-04 ENCOUNTER — Other Ambulatory Visit: Payer: Self-pay

## 2023-11-04 DIAGNOSIS — Z3041 Encounter for surveillance of contraceptive pills: Secondary | ICD-10-CM

## 2023-11-04 MED ORDER — LEVONORGEST-ETH ESTRAD 91-DAY 0.15-0.03 &0.01 MG PO TABS: 1.0000 | ORAL_TABLET | Freq: Every day | ORAL | 0 refills | Status: DC

## 2023-11-26 ENCOUNTER — Other Ambulatory Visit: Payer: Self-pay

## 2023-12-18 ENCOUNTER — Ambulatory Visit (INDEPENDENT_AMBULATORY_CARE_PROVIDER_SITE_OTHER)

## 2023-12-18 ENCOUNTER — Encounter: Payer: Self-pay | Admitting: Emergency Medicine

## 2023-12-18 ENCOUNTER — Ambulatory Visit
Admission: EM | Admit: 2023-12-18 | Discharge: 2023-12-18 | Disposition: A | Discharge status: Home / Self Care | Attending: Emergency Medicine | Admitting: Emergency Medicine

## 2023-12-18 DIAGNOSIS — J189 Pneumonia, unspecified organism: Secondary | ICD-10-CM

## 2023-12-18 DIAGNOSIS — R051 Acute cough: Secondary | ICD-10-CM | POA: Diagnosis not present

## 2023-12-18 DIAGNOSIS — J45998 Other asthma: Secondary | ICD-10-CM | POA: Diagnosis not present

## 2023-12-18 DIAGNOSIS — J45909 Unspecified asthma, uncomplicated: Secondary | ICD-10-CM | POA: Diagnosis not present

## 2023-12-18 DIAGNOSIS — J45901 Unspecified asthma with (acute) exacerbation: Secondary | ICD-10-CM | POA: Diagnosis not present

## 2023-12-18 MED ORDER — AMOXICILLIN-POT CLAVULANATE 875-125 MG PO TABS: 1.0000 | ORAL_TABLET | Freq: Two times a day (BID) | ORAL | 0 refills | Status: AC

## 2023-12-18 MED ORDER — PROMETHAZINE-DM 6.25-15 MG/5ML PO SYRP: 5.0000 mL | ORAL_SOLUTION | Freq: Four times a day (QID) | ORAL | 0 refills | Status: AC | PRN

## 2023-12-18 MED ORDER — IPRATROPIUM-ALBUTEROL 0.5-2.5 (3) MG/3ML IN SOLN
3.0000 mL | Freq: Once | RESPIRATORY_TRACT | Status: AC
  Administered 2023-12-18: 3 mL via RESPIRATORY_TRACT

## 2023-12-18 MED ORDER — AZITHROMYCIN 250 MG PO TABS
250.0000 mg | ORAL_TABLET | Freq: Every day | ORAL | 0 refills | Status: AC
Start: 2023-12-18 — End: ?

## 2023-12-18 MED ORDER — ALBUTEROL SULFATE (2.5 MG/3ML) 0.083% IN NEBU: 2.5000 mg | INHALATION_SOLUTION | Freq: Four times a day (QID) | RESPIRATORY_TRACT | 0 refills | Status: DC | PRN

## 2023-12-18 MED ORDER — PREDNISONE 20 MG PO TABS: 40.0000 mg | ORAL_TABLET | Freq: Every day | ORAL | 0 refills | Status: AC

## 2023-12-18 NOTE — ED Provider Notes (Signed)
 [facility]    CSN: [id number] Arrival date & time: 12/18/23  9188      History   Chief Complaint Chief Complaint  Patient presents with   Cough    HPI Tina Jennings is a 38 y.o. female.   Patient presents to clinic over concern of a dry cough for the past 3 weeks.  She does have a history of asthma and has been using albuterol  inhaler throughout the day without relief.  Endorses inability to take deep breaths due to this triggering coughing fits.  Cough is mostly dry, will occasionally cough up white mucus.  Feels hot and clammy.  Cough has been interfering with her ability to sleep and perform ADLs.  Gradually getting worse daily, last night was the worst that she decided to come in today.  Reports her heart rate is up from her albuterol  inhaler.  Does not have a nebulizer machine at home.  The history is provided by the patient and medical records.  Cough   Past Medical History:  Diagnosis Date   Arthritis    Asthma    Herniated lumbar intervertebral disc    Infertility    IT band syndrome    Low back pain    Migraines     Patient Active Problem List   Diagnosis Date Noted   Women's annual routine gynecological examination 12/29/2022   Strep pharyngitis 09/09/2021   COVID-19 01/08/2021   Strain of thoracic back region 11/22/2017   Oral contraceptive use 06/12/2014   Pap smear of cervix not needed 02/12/2013   Rh negative, maternal 02/08/2013   Migraine 10/11/2012    Past Surgical History:  Procedure Laterality Date   CESAREAN SECTION N/A 12/23/2012   Procedure: PRIMARY CESAREAN SECTION with delivery of Triplets;  Surgeon: Burnard VEAR Pate, MD;  Location: [facility];  Service: Obstetrics;  Laterality: N/A;    OB History     Gravida  1   Para  1   Term      Preterm  1   AB      Living  3      SAB      IAB      Ectopic      Multiple  1   Live Births  3            Home Medications    Prior to Admission medications    Medication Sig Start Date End Date Taking? Authorizing Provider  acetaminophen  (TYLENOL ) 325 MG tablet Take 650 mg by mouth every 6 (six) hours as needed for pain.   Yes [provider]  albuterol  (PROVENTIL ) (2.5 MG/3ML) 0.083% nebulizer solution Take 3 mLs (2.5 mg total) by nebulization every 6 (six) hours as needed for wheezing or shortness of breath. 12/18/23  Yes Loni Abdon  N, FNP  albuterol  (VENTOLIN  HFA) 108 (90 Base) MCG/ACT inhaler Inhale 2 puffs into the lungs every 4 (four) hours as needed for wheezing or shortness of breath. 02/11/19  Yes Jaycee Lenis, MD  amoxicillin -clavulanate (AUGMENTIN ) 875-125 MG tablet Take 1 tablet by mouth every 12 (twelve) hours for 5 days. 12/18/23 12/23/23 Yes Shaelynn Dragos  N, FNP  aspirin-acetaminophen -caffeine  (EXCEDRIN MIGRAINE) 250-250-65 MG tablet Take 1 tablet by mouth every 6 (six) hours as needed for headache.   Yes [provider]  azithromycin  (ZITHROMAX ) 250 MG tablet Take 1 tablet (250 mg total) by mouth daily. Take first 2 tablets together, then 1 every day until finished. 12/18/23  Yes Dreama, Carlyann Placide  N,  FNP  COLLAGEN PO Take by mouth daily.   Yes [provider]  Fluticasone -Salmeterol (ADVAIR) 100-50 MCG/DOSE AEPB Inhale 1 puff into the lungs 2 (two) times daily. 02/13/20  Yes Pauline Garnette LABOR, MD  Levonorgestrel-Ethinyl Estradiol (AMETHIA) 0.15-0.03 &0.01 MG tablet Take 1 tablet by mouth daily. 11/04/23  Yes Rasch, Delon FERNS, NP  Multiple Vitamin (MULTIVITAMIN) capsule Take 1 capsule by mouth daily.   Yes [provider]  Omega-3 Fatty Acids (FISH OIL) 1000 MG CAPS Take 1 capsule by mouth daily.   Yes [provider]  predniSONE  (DELTASONE ) 20 MG tablet Take 2 tablets (40 mg total) by mouth daily for 5 days. 12/18/23 12/23/23 Yes Demetruis Depaul  N, FNP  promethazine -dextromethorphan (PROMETHAZINE -DM) 6.25-15 MG/5ML syrup Take 5 mLs by mouth 4 (four) times daily as needed for cough. 12/18/23   Yes Allexis Bordenave  N, FNP  valACYclovir  (VALTREX ) 1000 MG tablet TAKE 1 TABLET (1,000 MG TOTAL) BY MOUTH DAILY AS NEEDED. NEEDS APPOINTMENT FOR FURTHER REFILLS 08/21/21  Yes Willo Mini, NP  butalbital -acetaminophen -caffeine  (FIORICET) 50-325-40 MG tablet Take 1 tablet by mouth 2 (two) times daily as needed for headache. 09/23/20   Willo Mini, NP  ibuprofen  (ADVIL ,MOTRIN ) 600 MG tablet Take 1 tablet (600 mg total) by mouth every 6 (six) hours as needed for pain. 12/25/12   Eveline Lynwood MATSU, MD    Family History Family History  Problem Relation Age of Onset   Skin cancer Mother    Diabetes Father    Hyperlipidemia Father    Hypertension Father    Heart disease Maternal Grandmother    Stroke Maternal Grandmother    Heart attack Maternal Grandmother    Breast cancer Maternal Grandmother    Heart disease Maternal Grandfather    Stroke Maternal Grandfather    Hypertension Maternal Grandfather    Heart disease Paternal Grandfather    Diabetes Paternal Grandfather    Stroke Paternal Grandfather    Hypertension Paternal Grandfather     Social History Social History   Tobacco Use   Smoking status: Never   Smokeless tobacco: Never  Vaping Use   Vaping status: Never Used  Substance Use Topics   Alcohol use: Yes    Alcohol/week: 3.0 standard drinks of alcohol    Types: 3 Standard drinks or equivalent per week   Drug use: Never     Allergies   Patient has no known allergies.   Review of Systems Review of Systems  Per HPI  Physical Exam Triage Vital Signs ED Triage Vitals  Encounter Vitals Group     BP 12/18/23 0823 (!) 133/90     Girls Systolic BP Percentile --      Girls Diastolic BP Percentile --      Boys Systolic BP Percentile --      Boys Diastolic BP Percentile --      Pulse Rate 12/18/23 0823 (!) 113     Resp 12/18/23 0823 20     Temp 12/18/23 0823 99.3 F (37.4 C)     Temp Source 12/18/23 0823 Oral     SpO2 12/18/23 0823 96 %     Weight 12/18/23 0825 170  lb (77.1 kg)     Height 12/18/23 0825 5' 3 (1.6 m)     Head Circumference --      Peak Flow --      Pain Score 12/18/23 0825 0     Pain Loc --      Pain Education --      Exclude  from Growth Chart --    No data found.  Updated Vital Signs BP (!) 133/90 (BP Location: Right Arm)   Pulse (!) 113   Temp 99.3 F (37.4 C) (Oral)   Resp 20   Ht 5' 3 (1.6 m)   Wt 170 lb (77.1 kg)   SpO2 96%   BMI 30.11 kg/m   Visual Acuity Right Eye Distance:   Left Eye Distance:   Bilateral Distance:    Right Eye Near:   Left Eye Near:    Bilateral Near:     Physical Exam Vitals and nursing note reviewed.  Constitutional:      Appearance: Normal appearance.  HENT:     Head: Normocephalic and atraumatic.     Right Ear: External ear normal.     Left Ear: External ear normal.     Nose: Nose normal.     Mouth/Throat:     Mouth: Mucous membranes are moist.  Eyes:     Conjunctiva/sclera: Conjunctivae normal.  Cardiovascular:     Rate and Rhythm: Regular rhythm. Tachycardia present.     Heart sounds: Normal heart sounds. No murmur heard. Pulmonary:     Effort: Pulmonary effort is normal. No respiratory distress.     Breath sounds: Decreased air movement present. No wheezing or rhonchi.     Comments: Inability to take deep breaths limits exam, no audible wheezing or rhonchi. Skin:    General: Skin is warm and dry.  Neurological:     General: No focal deficit present.     Mental Status: She is alert and oriented to person, place, and time. Mental status is at baseline.  Psychiatric:        Mood and Affect: Mood normal.        Behavior: Behavior normal. Behavior is cooperative.      UC Treatments / Results  Labs (all labs ordered are listed, but only abnormal results are displayed) Labs Reviewed - No data to display  EKG   Radiology DG Chest 2 View Result Date: 12/18/2023 EXAM: 2 VIEW(S) XRAY OF THE CHEST 12/18/2023 09:13:55 AM COMPARISON: 02/23/2020 CLINICAL HISTORY: Cough,  SOB x 3 weeks. Cough x 3 week, hx of asthma, low grade fever and SOB. FINDINGS: LUNGS AND PLEURA: Airspace disease is identified within the left lower lobe. Right lung is clear. No pulmonary edema. No pleural effusion. No pneumothorax. HEART AND MEDIASTINUM: No acute abnormality of the cardiac and mediastinal silhouettes. BONES AND SOFT TISSUES: No acute osseous abnormality. IMPRESSION: 1. Left lower lobe airspace disease compatible with pneumonia. Electronically signed by: Waddell Calk MD 12/18/2023 09:21 AM EDT RP Workstation: [id number]    Procedures Procedures (including critical care time)  Medications Ordered in UC Medications  ipratropium-albuterol  (DUONEB) 0.5-2.5 (3) MG/3ML nebulizer solution 3 mL (3 mLs Nebulization Given 12/18/23 0859)    Initial Impression / Assessment and Plan / UC Course  I have reviewed the triage vital signs and the nursing notes.  Pertinent labs & imaging results that were available during my care of the patient were reviewed by me and considered in my medical decision making (see chart for details).  Vitals and triage reviewed, patient is hemodynamically stable.  Tachycardic.  Lung sounds diminished due to inability to take deep breaths.  Chest x-ray by my interpretation shows left lower lobe infiltrate, confirmed with radiology overread.  Duo-neb given in clinic, patient continues to have dry cough and shallow breathing.  Reports she does feel like she can cough a little deeper after  DuoNeb.  Will treat for CAP with Augmentin  and azithromycin .  Nebulizer equipment given to go home with to treat asthma exacerbation as well as steroid burst.  Cough management discussed.  Plan of care, follow-up care return precautions given, no questions at this time.     Final Clinical Impressions(s) / UC Diagnoses   Final diagnoses:  Acute cough  Community acquired pneumonia of left lower lobe of lung  Asthma with acute exacerbation, unspecified asthma severity,  unspecified whether persistent     Discharge Instructions      Your chest x-ray confirm that you have left lower lobe pneumonia.  Take the antibiotics as prescribed and until finished, take them with food to help prevent stomach upset.  Start the steroids today and take them daily with breakfast until finished.  Use the breathing treatment every 6 hours as needed, can either do this or your inhaler.  Cough syrup up to 4 times daily, this medication may cause sedation or drowsiness so do not drink alcohol or drive on this.  Symptoms should improve over the next few days, if no improvement or any changes follow-up with your primary care provider or return to clinic for reevaluation.      ED Prescriptions     Medication Sig Dispense Auth. Provider   amoxicillin -clavulanate (AUGMENTIN ) 875-125 MG tablet Take 1 tablet by mouth every 12 (twelve) hours for 5 days. 10 tablet Dreama, Trevonn Hallum  N, FNP   azithromycin  (ZITHROMAX ) 250 MG tablet Take 1 tablet (250 mg total) by mouth daily. Take first 2 tablets together, then 1 every day until finished. 6 tablet Dreama, Michille Mcelrath  N, FNP   promethazine -dextromethorphan (PROMETHAZINE -DM) 6.25-15 MG/5ML syrup Take 5 mLs by mouth 4 (four) times daily as needed for cough. 118 mL Dreama, Pryce Folts  N, FNP   predniSONE  (DELTASONE ) 20 MG tablet Take 2 tablets (40 mg total) by mouth daily for 5 days. 10 tablet Dreama, Spirit Wernli  N, FNP   albuterol  (PROVENTIL ) (2.5 MG/3ML) 0.083% nebulizer solution Take 3 mLs (2.5 mg total) by nebulization every 6 (six) hours as needed for wheezing or shortness of breath. 75 mL Dreama, Geanine Vandekamp  N, FNP      PDMP not reviewed this encounter.   Dreama Norbert SAILOR, [place] 12/18/23 [phone]

## 2023-12-18 NOTE — Discharge Instructions (Signed)
 Your chest x-ray confirm that you have left lower lobe pneumonia.  Take the antibiotics as prescribed and until finished, take them with food to help prevent stomach upset.  Start the steroids today and take them daily with breakfast until finished.  Use the breathing treatment every 6 hours as needed, can either do this or your inhaler.  Cough syrup up to 4 times daily, this medication may cause sedation or drowsiness so do not drink alcohol or drive on this.  Symptoms should improve over the next few days, if no improvement or any changes follow-up with your primary care provider or return to clinic for reevaluation.

## 2023-12-18 NOTE — ED Triage Notes (Signed)
 Patient c/o cough x 3 weeks, post nasal drainage, SOB, congestion, history of asthma.  Patient is using her rescue inhaler daily.  Afebrile.

## 2023-12-28 ENCOUNTER — Other Ambulatory Visit: Payer: Self-pay

## 2023-12-28 ENCOUNTER — Ambulatory Visit: Payer: Self-pay | Admitting: Internal Medicine

## 2023-12-28 ENCOUNTER — Ambulatory Visit (INDEPENDENT_AMBULATORY_CARE_PROVIDER_SITE_OTHER)

## 2023-12-28 ENCOUNTER — Ambulatory Visit
Admission: EM | Admit: 2023-12-28 | Discharge: 2023-12-28 | Disposition: A | Discharge status: Home / Self Care | Attending: Internal Medicine | Admitting: Internal Medicine

## 2023-12-28 DIAGNOSIS — J40 Bronchitis, not specified as acute or chronic: Secondary | ICD-10-CM

## 2023-12-28 DIAGNOSIS — R0602 Shortness of breath: Secondary | ICD-10-CM

## 2023-12-28 DIAGNOSIS — R051 Acute cough: Secondary | ICD-10-CM | POA: Diagnosis not present

## 2023-12-28 DIAGNOSIS — R053 Chronic cough: Secondary | ICD-10-CM | POA: Diagnosis not present

## 2023-12-28 DIAGNOSIS — J189 Pneumonia, unspecified organism: Secondary | ICD-10-CM | POA: Diagnosis not present

## 2023-12-28 HISTORY — DX: Pneumonia, unspecified organism: J18.9

## 2023-12-28 LAB — POC SARS CORONAVIRUS 2 AG -  ED: SARS Coronavirus 2 Ag: NEGATIVE

## 2023-12-28 MED ORDER — BENZONATATE 100 MG PO CAPS: 100.0000 mg | ORAL_CAPSULE | Freq: Three times a day (TID) | ORAL | 0 refills | Status: AC

## 2023-12-28 MED ORDER — PREDNISONE 10 MG PO TABS: ORAL_TABLET | ORAL | 0 refills | Status: AC

## 2023-12-28 MED ORDER — ALBUTEROL SULFATE (2.5 MG/3ML) 0.083% IN NEBU: 2.5000 mg | INHALATION_SOLUTION | Freq: Four times a day (QID) | RESPIRATORY_TRACT | 0 refills | Status: AC | PRN

## 2023-12-28 NOTE — ED Triage Notes (Addendum)
 dx with pna on 8/16. Finished abx and steroids last Wednesday. Still does not feel better. Not coughing as much but is coughing deeper and it is prod of green sputum. No fever. Tachycardic but sts she just did an albuterol  tx. Feels fatigued, left lower lobe hurts more. Hx asthma. Has used tylenol , ibuprofen , albuterol  inhaler, albuterol  nebulizer.

## 2023-12-28 NOTE — Discharge Instructions (Addendum)
 COVID testing done today.  This was negative  Chest x-ray done today.  Final evaluation by the radiologist is still pending but on brief evaluation there is significant improvement from previous.  Likely symptoms are secondary to inflammation in the airway that is residual.  We will treat this with a longer steroid taper.  We will also prescribe a different medication to help with cough.  May continue to use the albuterol  nebulizer if needed for shortness of breath.  We will treat with the following Prednisone  taper, 60 mg for 3 days, then 40 mg for 3 days, then 20 mg for 3 days, then 10 mg for 3 days.  This is a steroid.  Take this with food. Benzonatate  (tessalon ) 100 mg every 8 hours as needed for cough.   Make sure to stay hydrated by drinking plenty of water.  Return to urgent care or PCP if symptoms worsen or fail to resolve.

## 2023-12-28 NOTE — ED Provider Notes (Signed)
 Tina Jennings    CSN: [id number] Arrival date & time: 12/28/23  1251      History   Chief Complaint Chief Complaint  Patient presents with   Cough    HPI Tina Jennings is a 38 y.o. female.   38 year old female presents urgent Jennings secondary to persistent cough, shortness of breath and now with left-sided pain in her rib cage/base of the chest.  The patient was diagnosed with left lower lobe pneumonia on August 16.  She was prescribed Augmentin  and azithromycin  as well as prednisone .  She reports that she finished the medication on Wednesday but was still having persistent symptoms.  Thursday she was slightly better but then on Friday her symptoms came back with shortness of breath and severe cough that was now productive.  She was also having pain along the left side at the base of the chest that was positional.  It was worse with coughing.  She reports that she is still feeling very fatigued as well.  She denies fevers, nausea, vomiting, abdominal pain, dysuria, hematuria.  She did a albuterol  approximately 30 minutes prior to arrival and has been very sensitive to this as far as tachycardia goes.  She does not think she has had any sick contacts since she has been on the antibiotics.   Cough Associated symptoms: chest pain and shortness of breath   Associated symptoms: no chills, no ear pain, no fever, no rash and no sore throat     Past Medical History:  Diagnosis Date   Arthritis    Asthma    Herniated lumbar intervertebral disc    Infertility    IT band syndrome    Low back pain    Migraines    Pneumonia     Patient Active Problem List   Diagnosis Date Noted   Women's annual routine gynecological examination 12/29/2022   Strep pharyngitis 09/09/2021   COVID-19 01/08/2021   Strain of thoracic back region 11/22/2017   Oral contraceptive use 06/12/2014   Pap smear of cervix not needed 02/12/2013   Rh negative, maternal 02/08/2013   Migraine 10/11/2012     Past Surgical History:  Procedure Laterality Date   CESAREAN SECTION N/A 12/23/2012   Procedure: PRIMARY CESAREAN SECTION with delivery of Triplets;  Surgeon: Burnard VEAR Pate, MD;  Location: [facility];  Service: Obstetrics;  Laterality: N/A;    OB History     Gravida  1   Para  1   Term      Preterm  1   AB      Living  3      SAB      IAB      Ectopic      Multiple  1   Live Births  3            Home Medications    Prior to Admission medications   Medication Sig Start Date End Date Taking? Authorizing Provider  benzonatate  (TESSALON ) 100 MG capsule Take 1 capsule (100 mg total) by mouth every 8 (eight) hours. 12/28/23  Yes Keiji Melland A, PA-C  predniSONE  (DELTASONE ) 10 MG tablet Take 6 tablets (60 mg total) by mouth daily with breakfast for 3 days, THEN 4 tablets (40 mg total) daily with breakfast for 3 days, THEN 2 tablets (20 mg total) daily with breakfast for 3 days, THEN 1 tablet (10 mg total) daily with breakfast for 3 days. 12/28/23 01/09/24 Yes Esvin Hnat A, PA-C  acetaminophen  (TYLENOL )  325 MG tablet Take 650 mg by mouth every 6 (six) hours as needed for pain.    [provider]  albuterol  (PROVENTIL ) (2.5 MG/3ML) 0.083% nebulizer solution Take 3 mLs (2.5 mg total) by nebulization every 6 (six) hours as needed for wheezing or shortness of breath. 12/28/23   Teresa Almarie LABOR, PA-C  albuterol  (VENTOLIN  HFA) 108 (90 Base) MCG/ACT inhaler Inhale 2 puffs into the lungs every 4 (four) hours as needed for wheezing or shortness of breath. 02/11/19   Jaycee Lenis, MD  aspirin-acetaminophen -caffeine  (EXCEDRIN MIGRAINE) 250-250-65 MG tablet Take 1 tablet by mouth every 6 (six) hours as needed for headache.    [provider]  azithromycin  (ZITHROMAX ) 250 MG tablet Take 1 tablet (250 mg total) by mouth daily. Take first 2 tablets together, then 1 every day until finished. 12/18/23   Dreama, [place]  N, FNP  butalbital -acetaminophen -caffeine   (FIORICET) 50-325-40 MG tablet Take 1 tablet by mouth 2 (two) times daily as needed for headache. 09/23/20   Willo Mini, NP  COLLAGEN PO Take by mouth daily.    [provider]  Fluticasone -Salmeterol (ADVAIR) 100-50 MCG/DOSE AEPB Inhale 1 puff into the lungs 2 (two) times daily. 02/13/20   Pauline Garnette LABOR, MD  ibuprofen  (ADVIL ,MOTRIN ) 600 MG tablet Take 1 tablet (600 mg total) by mouth every 6 (six) hours as needed for pain. 12/25/12   Arnold, James G, MD  Levonorgestrel-Ethinyl Estradiol (AMETHIA) 0.15-0.03 &0.01 MG tablet Take 1 tablet by mouth daily. 11/04/23   Rasch, Delon I, NP  Multiple Vitamin (MULTIVITAMIN) capsule Take 1 capsule by mouth daily.    [provider]  Omega-3 Fatty Acids (FISH OIL) 1000 MG CAPS Take 1 capsule by mouth daily.    [provider]  promethazine -dextromethorphan (PROMETHAZINE -DM) 6.25-15 MG/5ML syrup Take 5 mLs by mouth 4 (four) times daily as needed for cough. 12/18/23   Dreama, [place]  N, FNP  valACYclovir  (VALTREX ) 1000 MG tablet TAKE 1 TABLET (1,000 MG TOTAL) BY MOUTH DAILY AS NEEDED. NEEDS APPOINTMENT FOR FURTHER REFILLS 08/21/21   Willo Mini, NP    Family History Family History  Problem Relation Age of Onset   Skin cancer Mother    Diabetes Father    Hyperlipidemia Father    Hypertension Father    Heart disease Maternal Grandmother    Stroke Maternal Grandmother    Heart attack Maternal Grandmother    Breast cancer Maternal Grandmother    Heart disease Maternal Grandfather    Stroke Maternal Grandfather    Hypertension Maternal Grandfather    Heart disease Paternal Grandfather    Diabetes Paternal Grandfather    Stroke Paternal Grandfather    Hypertension Paternal Grandfather     Social History Social History   Tobacco Use   Smoking status: Never   Smokeless tobacco: Never  Vaping Use   Vaping status: Never Used  Substance Use Topics   Alcohol use: Yes    Alcohol/week: 3.0 standard drinks of alcohol     Types: 3 Standard drinks or equivalent per week   Drug use: Never     Allergies   Patient has no known allergies.   Review of Systems Review of Systems  Constitutional:  Negative for chills and fever.  HENT:  Negative for ear pain and sore throat.   Eyes:  Negative for pain and visual disturbance.  Respiratory:  Positive for cough and shortness of breath.   Cardiovascular:  Positive for chest pain. Negative for palpitations.  Gastrointestinal:  Negative for abdominal pain and  vomiting.  Genitourinary:  Negative for dysuria and hematuria.  Musculoskeletal:  Negative for arthralgias and back pain.  Skin:  Negative for color change and rash.  Neurological:  Negative for seizures and syncope.  All other systems reviewed and are negative.    Physical Exam Triage Vital Signs ED Triage Vitals  Encounter Vitals Group     BP 12/28/23 1257 (!) 139/100     Girls Systolic BP Percentile --      Girls Diastolic BP Percentile --      Boys Systolic BP Percentile --      Boys Diastolic BP Percentile --      Pulse Rate 12/28/23 1257 (!) 135     Resp 12/28/23 1257 18     Temp 12/28/23 1257 98.3 F (36.8 C)     Temp src --      SpO2 12/28/23 1257 96 %     Weight --      Height --      Head Circumference --      Peak Flow --      Pain Score 12/28/23 1301 3     Pain Loc --      Pain Education --      Exclude from Growth Chart --    No data found.  Updated Vital Signs BP (!) 139/100   Pulse (!) 135   Temp 98.3 F (36.8 C)   Resp 18   SpO2 96%   Visual Acuity Right Eye Distance:   Left Eye Distance:   Bilateral Distance:    Right Eye Near:   Left Eye Near:    Bilateral Near:     Physical Exam Vitals and nursing note reviewed.  Constitutional:      General: She is not in acute distress.    Appearance: She is well-developed.  HENT:     Head: Normocephalic and atraumatic.  Eyes:     Conjunctiva/sclera: Conjunctivae normal.  Cardiovascular:     Rate and Rhythm:  Regular rhythm. Tachycardia present.     Heart sounds: No murmur heard. Pulmonary:     Effort: Pulmonary effort is normal. No tachypnea or respiratory distress.     Breath sounds: Normal breath sounds. No decreased breath sounds or wheezing.  Abdominal:     Palpations: Abdomen is soft.     Tenderness: There is no abdominal tenderness.  Musculoskeletal:        General: No swelling.     Cervical back: Neck supple.  Skin:    General: Skin is warm and dry.     Capillary Refill: Capillary refill takes less than 2 seconds.  Neurological:     Mental Status: She is alert.  Psychiatric:        Mood and Affect: Mood normal.      UC Treatments / Results  Labs (all labs ordered are listed, but only abnormal results are displayed) Labs Reviewed  POC SARS CORONAVIRUS 2 AG -  ED    EKG   Radiology No results found.  Procedures Procedures (including critical Jennings time)  Medications Ordered in UC Medications - No data to display  Initial Impression / Assessment and Plan / UC Course  I have reviewed the triage vital signs and the nursing notes.  Pertinent labs & imaging results that were available during my Jennings of the patient were reviewed by me and considered in my medical decision making (see chart for details).     Acute cough - Plan: POC SARS Coronavirus 2  Ag-ED - Nasal Swab, DG Chest 2 View, POC SARS Coronavirus 2 Ag-ED - Nasal Swab, DG Chest 2 View  Shortness of breath - Plan: POC SARS Coronavirus 2 Ag-ED - Nasal Swab, DG Chest 2 View, POC SARS Coronavirus 2 Ag-ED - Nasal Swab, DG Chest 2 View  Bronchitis   COVID testing done today to ensure that there was not an overlying viral infection since starting antibiotics for bacterial pneumonia.  This was negative  Chest x-ray done today.  Final evaluation by the radiologist is still pending but on brief evaluation there is significant improvement from previous.  Likely symptoms are secondary to inflammation in the airway that  is residual.  We will treat this with a longer steroid taper.  We will also prescribe a different medication to help with cough.  May continue to use the albuterol  nebulizer if needed for shortness of breath.  We will treat with the following Prednisone  taper, 60 mg for 3 days, then 40 mg for 3 days, then 20 mg for 3 days, then 10 mg for 3 days.  This is a steroid.  Take this with food. Benzonatate  (tessalon ) 100 mg every 8 hours as needed for cough.   Make sure to stay hydrated by drinking plenty of water.  Return to urgent Jennings or PCP if symptoms worsen or fail to resolve.    Final Clinical Impressions(s) / UC Diagnoses   Final diagnoses:  Acute cough  Shortness of breath  Bronchitis     Discharge Instructions      COVID testing done today.  This was negative  Chest x-ray done today.  Final evaluation by the radiologist is still pending but on brief evaluation there is significant improvement from previous.  Likely symptoms are secondary to inflammation in the airway that is residual.  We will treat this with a longer steroid taper.  We will also prescribe a different medication to help with cough.  May continue to use the albuterol  nebulizer if needed for shortness of breath.  We will treat with the following Prednisone  taper, 60 mg for 3 days, then 40 mg for 3 days, then 20 mg for 3 days, then 10 mg for 3 days.  This is a steroid.  Take this with food. Benzonatate  (tessalon ) 100 mg every 8 hours as needed for cough.   Make sure to stay hydrated by drinking plenty of water.  Return to urgent Jennings or PCP if symptoms worsen or fail to resolve.       ED Prescriptions     Medication Sig Dispense Auth. Provider   predniSONE  (DELTASONE ) 10 MG tablet Take 6 tablets (60 mg total) by mouth daily with breakfast for 3 days, THEN 4 tablets (40 mg total) daily with breakfast for 3 days, THEN 2 tablets (20 mg total) daily with breakfast for 3 days, THEN 1 tablet (10 mg total) daily with  breakfast for 3 days. 39 tablet Talita Recht A, PA-C   albuterol  (PROVENTIL ) (2.5 MG/3ML) 0.083% nebulizer solution Take 3 mLs (2.5 mg total) by nebulization every 6 (six) hours as needed for wheezing or shortness of breath. 75 mL Messiah Ahr A, PA-C   benzonatate  (TESSALON ) 100 MG capsule Take 1 capsule (100 mg total) by mouth every 8 (eight) hours. 21 capsule Teresa Almarie LABOR, [place]      PDMP not reviewed this encounter.   Teresa Almarie LABOR, [place] 12/28/23 1406

## 2024-01-24 ENCOUNTER — Telehealth: Payer: Self-pay

## 2024-01-24 NOTE — Telephone Encounter (Addendum)
 RN received faxed from [facility] regarding refill request. RN attempted to call patient to confirm, left HIPAA compliant voicemail to return RN call.  Silvano LELON Piano, RN  Patient returned RN call and reported not in need of birth control medication refill, has 2 months left to get her until her annual.  Silvano LELON Piano, RN

## 2024-02-02 ENCOUNTER — Ambulatory Visit: Admitting: Obstetrics and Gynecology

## 2024-02-02 ENCOUNTER — Encounter: Payer: Self-pay | Admitting: Obstetrics and Gynecology

## 2024-02-02 VITALS — BP 128/84 | HR 92 | Ht 63.0 in | Wt 184.0 lb

## 2024-02-02 DIAGNOSIS — Z3041 Encounter for surveillance of contraceptive pills: Secondary | ICD-10-CM

## 2024-02-02 DIAGNOSIS — Z01419 Encounter for gynecological examination (general) (routine) without abnormal findings: Secondary | ICD-10-CM

## 2024-02-02 MED ORDER — LEVONORGEST-ETH ESTRAD 91-DAY 0.15-0.03 &0.01 MG PO TABS: 1.0000 | ORAL_TABLET | Freq: Every day | ORAL | 4 refills | Status: DC

## 2024-02-02 NOTE — Progress Notes (Signed)
 ANNUAL GYNECOLOGY VISIT Chief Complaint  Patient presents with   Gynecologic Exam     Subjective:  Tina Jennings is a 38 y.o. G1P0103 who presents for annual  Doing well, no concerns.  Gyn History: Patient's last menstrual period was 12/22/2023 (approximate). Sexually active: yes/no: Yes Contraception: oral contraceptives (estrogen/progesterone) History of STIs: No Last pap:  Lab Results  Component Value Date   DIAGPAP  12/29/2022    - Negative for intraepithelial lesion or malignancy (NILM)   HPV NOT DETECTED 11/03/2016   HPVHIGH Negative 12/29/2022   History of abnormal pap: No Periods: regular every 90 days on pills    The pregnancy intention screening data noted above was reviewed. Potential methods of contraception were discussed. The patient elected to proceed with No data recorded.       09/23/2020   11:23 AM  Depression screen PHQ 2/9  Decreased Interest 0  Down, Depressed, Hopeless 0  PHQ - 2 Score 0         No data to display            OB History     Gravida  1   Para  1   Term      Preterm  1   AB      Living  3      SAB      IAB      Ectopic      Multiple  1   Live Births  3           Past Medical History:  Diagnosis Date   Arthritis    Asthma    Herniated lumbar intervertebral disc    Infertility    IT band syndrome    Low back pain    Migraines    Pneumonia     Past Surgical History:  Procedure Laterality Date   CESAREAN SECTION N/A 12/23/2012   Procedure: PRIMARY CESAREAN SECTION with delivery of Triplets;  Surgeon: Burnard VEAR Pate, MD;  Location: [facility];  Service: Obstetrics;  Laterality: N/A;    Social History   Socioeconomic History   Marital status: Married    Spouse name: Not on file   Number of children: Not on file   Years of education: Not on file   Highest education level: Not on file  Occupational History   Occupation: Nurse    Employer: [employer]  Tobacco Use   Smoking  status: Never   Smokeless tobacco: Never  Vaping Use   Vaping status: Never Used  Substance and Sexual Activity   Alcohol use: Yes    Alcohol/week: 3.0 standard drinks of alcohol    Types: 3 Standard drinks or equivalent per week   Drug use: Never   Sexual activity: Yes    Partners: Male    Birth control/protection: Pill  Other Topics Concern   Not on file  Social History Narrative   Not on file   Social Drivers of Health   Financial Resource Strain: Not on file  Food Insecurity: Not on file  Transportation Needs: Not on file  Physical Activity: Not on file  Stress: Not on file  Social Connections: Not on file    Family History  Problem Relation Age of Onset   Skin cancer Mother    Diabetes Father    Hyperlipidemia Father    Hypertension Father    Heart disease Maternal Grandmother    Stroke Maternal Grandmother    Heart attack Maternal Grandmother  Breast cancer Maternal Grandmother    Heart disease Maternal Grandfather    Stroke Maternal Grandfather    Hypertension Maternal Grandfather    Heart disease Paternal Grandfather    Diabetes Paternal Grandfather    Stroke Paternal Grandfather    Hypertension Paternal Grandfather     Current Outpatient Medications on File Prior to Visit  Medication Sig Dispense Refill   acetaminophen  (TYLENOL ) 325 MG tablet Take 650 mg by mouth every 6 (six) hours as needed for pain.     albuterol  (VENTOLIN  HFA) 108 (90 Base) MCG/ACT inhaler Inhale 2 puffs into the lungs every 4 (four) hours as needed for wheezing or shortness of breath. 18 g 0   aspirin-acetaminophen -caffeine  (EXCEDRIN MIGRAINE) 250-250-65 MG tablet Take 1 tablet by mouth every 6 (six) hours as needed for headache.     butalbital -acetaminophen -caffeine  (FIORICET) 50-325-40 MG tablet Take 1 tablet by mouth 2 (two) times daily as needed for headache. 14 tablet 1   COLLAGEN PO Take by mouth daily.     Fluticasone -Salmeterol (ADVAIR) 100-50 MCG/DOSE AEPB Inhale 1 puff  into the lungs 2 (two) times daily. 60 each 1   Levonorgestrel-Ethinyl Estradiol (AMETHIA) 0.15-0.03 &0.01 MG tablet Take 1 tablet by mouth daily. 60 tablet 0   Multiple Vitamin (MULTIVITAMIN) capsule Take 1 capsule by mouth daily.     Omega-3 Fatty Acids (FISH OIL) 1000 MG CAPS Take 1 capsule by mouth daily.     valACYclovir  (VALTREX ) 1000 MG tablet TAKE 1 TABLET (1,000 MG TOTAL) BY MOUTH DAILY AS NEEDED. NEEDS APPOINTMENT FOR FURTHER REFILLS 7 tablet 0   albuterol  (PROVENTIL ) (2.5 MG/3ML) 0.083% nebulizer solution Take 3 mLs (2.5 mg total) by nebulization every 6 (six) hours as needed for wheezing or shortness of breath. (Patient not taking: Reported on 02/02/2024) 75 mL 0   azithromycin  (ZITHROMAX ) 250 MG tablet Take 1 tablet (250 mg total) by mouth daily. Take first 2 tablets together, then 1 every day until finished. (Patient not taking: Reported on 02/02/2024) 6 tablet 0   benzonatate  (TESSALON ) 100 MG capsule Take 1 capsule (100 mg total) by mouth every 8 (eight) hours. (Patient not taking: Reported on 02/02/2024) 21 capsule 0   ibuprofen  (ADVIL ,MOTRIN ) 600 MG tablet Take 1 tablet (600 mg total) by mouth every 6 (six) hours as needed for pain. 30 tablet 0   promethazine -dextromethorphan (PROMETHAZINE -DM) 6.25-15 MG/5ML syrup Take 5 mLs by mouth 4 (four) times daily as needed for cough. 118 mL 0   No current facility-administered medications on file prior to visit.    No Known Allergies   Objective:   Vitals:   02/02/24 1056  BP: 128/84  Pulse: 92  Weight: 184 lb 0.6 oz (83.5 kg)  Height: 5' 3 (1.6 m)   Physical Examination:   General appearance - well appearing, and in no distress  Mental status - alert, oriented to person, place, and time  Psych:  normal mood and affect  Skin - warm and dry, normal color, no suspicious lesions noted  Breasts - breasts appear normal, no suspicious masses, no skin or nipple changes or  axillary nodes  Abdomen - soft, nontender, nondistended, no  masses or organomegaly  Pelvic -  VULVA: normal appearing vulva with no masses, tenderness or lesions   VAGINA: normal appearing vagina with normal color and discharge, no lesions   CERVIX: normal appearing cervix without discharge or lesions, no CMT  UTERUS: uterus is felt to be normal size, shape, consistency and nontender   ADNEXA: No adnexal  masses or tenderness noted.  Extremities:  No swelling or varicosities noted  Chaperone present for exam  Assessment and Plan:  1. Well woman exam with routine gynecological exam (Primary) Pap up to date  Normal clinical breast exam, reviewed self breast exams, mammograms to start at age 84 Declines STI testing Refill on ocps  2. Oral contraceptive pill surveillance Denies history of hypertension, venous thromboembolism, migraines with aura, liver disease or smoking.  - Levonorgestrel-Ethinyl Estradiol (AMETHIA) 0.15-0.03 &0.01 MG tablet; Take 1 tablet by mouth daily.  Dispense: 90 tablet; Refill: 4    Rollo ONEIDA Bring, MD, FACOG Obstetrician & Gynecologist, [facility], [facility]

## 2024-04-11 ENCOUNTER — Other Ambulatory Visit: Payer: Self-pay

## 2024-04-11 ENCOUNTER — Encounter: Payer: Self-pay | Admitting: Obstetrics and Gynecology

## 2024-04-11 DIAGNOSIS — Z3041 Encounter for surveillance of contraceptive pills: Secondary | ICD-10-CM

## 2024-04-11 MED ORDER — LEVONORGEST-ETH ESTRAD 91-DAY 0.15-0.03 &0.01 MG PO TABS: 1.0000 | ORAL_TABLET | Freq: Every day | ORAL | 0 refills | Status: AC

## 2024-04-11 MED ORDER — LEVONORGEST-ETH ESTRAD 91-DAY 0.15-0.03 &0.01 MG PO TABS: 1.0000 | ORAL_TABLET | Freq: Every day | ORAL | 4 refills | Status: DC

## 2024-04-11 NOTE — Progress Notes (Signed)
 Patient states that prescription needs to be sent to [facility] mail order pharmacy. Delon Barrio RN
